# Patient Record
Sex: Male | Born: 1965 | Race: White | Hispanic: No | Marital: Single | Smoking: Current every day smoker
Health system: Southern US, Community
[De-identification: ages and names within clinical notes are randomized; demographics above are authoritative.]

## PROBLEM LIST (undated history)

## (undated) DIAGNOSIS — R6882 Decreased libido: Secondary | ICD-10-CM

## (undated) DIAGNOSIS — F17200 Nicotine dependence, unspecified, uncomplicated: Secondary | ICD-10-CM

## (undated) DIAGNOSIS — F32A Depression, unspecified: Secondary | ICD-10-CM

## (undated) DIAGNOSIS — G63 Polyneuropathy in diseases classified elsewhere: Secondary | ICD-10-CM

## (undated) DIAGNOSIS — G8929 Other chronic pain: Secondary | ICD-10-CM

## (undated) DIAGNOSIS — F321 Major depressive disorder, single episode, moderate: Secondary | ICD-10-CM

## (undated) DIAGNOSIS — E1149 Type 2 diabetes mellitus with other diabetic neurological complication: Secondary | ICD-10-CM

## (undated) DIAGNOSIS — M545 Low back pain: Secondary | ICD-10-CM

## (undated) DIAGNOSIS — F329 Major depressive disorder, single episode, unspecified: Secondary | ICD-10-CM

## (undated) HISTORY — DX: Other chronic pain: G89.29

## (undated) HISTORY — PX: HERNIA REPAIR: SHX51

## (undated) HISTORY — DX: Depression, unspecified: F32.A

## (undated) HISTORY — DX: Decreased libido: R68.82

## (undated) HISTORY — DX: Nicotine dependence, unspecified, uncomplicated: F17.200

## (undated) HISTORY — DX: Polyneuropathy in diseases classified elsewhere: G63

## (undated) HISTORY — DX: Type 2 diabetes mellitus with other diabetic neurological complication: E11.49

## (undated) HISTORY — DX: Major depressive disorder, single episode, moderate: F32.1

## (undated) HISTORY — DX: Major depressive disorder, single episode, unspecified: F32.9

## (undated) HISTORY — DX: Low back pain: M54.5

---

## 1999-05-03 ENCOUNTER — Emergency Department (HOSPITAL_COMMUNITY): Admission: EM | Admit: 1999-05-03 | Discharge: 1999-05-03 | Payer: Self-pay | Admitting: Emergency Medicine

## 1999-05-03 ENCOUNTER — Encounter: Payer: Self-pay | Admitting: Emergency Medicine

## 2001-11-23 ENCOUNTER — Emergency Department (HOSPITAL_COMMUNITY): Admission: EM | Admit: 2001-11-23 | Discharge: 2001-11-23 | Payer: Self-pay | Admitting: Emergency Medicine

## 2003-08-22 ENCOUNTER — Emergency Department (HOSPITAL_COMMUNITY): Admission: EM | Admit: 2003-08-22 | Discharge: 2003-08-22 | Payer: Self-pay | Admitting: Internal Medicine

## 2003-09-05 ENCOUNTER — Emergency Department (HOSPITAL_COMMUNITY): Admission: EM | Admit: 2003-09-05 | Discharge: 2003-09-05 | Payer: Self-pay | Admitting: Emergency Medicine

## 2003-12-15 ENCOUNTER — Emergency Department (HOSPITAL_COMMUNITY): Admission: EM | Admit: 2003-12-15 | Discharge: 2003-12-15 | Payer: Self-pay | Admitting: Emergency Medicine

## 2003-12-25 ENCOUNTER — Emergency Department (HOSPITAL_COMMUNITY): Admission: EM | Admit: 2003-12-25 | Discharge: 2003-12-25 | Payer: Self-pay | Admitting: Emergency Medicine

## 2014-02-11 ENCOUNTER — Encounter (HOSPITAL_COMMUNITY): Payer: Self-pay | Admitting: Emergency Medicine

## 2014-02-11 ENCOUNTER — Emergency Department (HOSPITAL_COMMUNITY)
Admission: EM | Admit: 2014-02-11 | Discharge: 2014-02-11 | Disposition: A | Discharge status: Home / Self Care | Payer: Self-pay | Attending: Emergency Medicine | Admitting: Emergency Medicine

## 2014-02-11 DIAGNOSIS — E119 Type 2 diabetes mellitus without complications: Secondary | ICD-10-CM | POA: Insufficient documentation

## 2014-02-11 DIAGNOSIS — M79671 Pain in right foot: Secondary | ICD-10-CM

## 2014-02-11 DIAGNOSIS — Z794 Long term (current) use of insulin: Secondary | ICD-10-CM | POA: Insufficient documentation

## 2014-02-11 DIAGNOSIS — M79672 Pain in left foot: Secondary | ICD-10-CM

## 2014-02-11 DIAGNOSIS — M79609 Pain in unspecified limb: Secondary | ICD-10-CM | POA: Insufficient documentation

## 2014-02-11 DIAGNOSIS — R000 Tachycardia, unspecified: Secondary | ICD-10-CM | POA: Insufficient documentation

## 2014-02-11 DIAGNOSIS — F17200 Nicotine dependence, unspecified, uncomplicated: Secondary | ICD-10-CM | POA: Insufficient documentation

## 2014-02-11 LAB — CBG MONITORING, ED: Glucose-Capillary: 197 mg/dL — ABNORMAL HIGH (ref 70–99)

## 2014-02-11 MED ORDER — GABAPENTIN 100 MG PO CAPS: 100.0000 mg | ORAL_CAPSULE | Freq: Three times a day (TID) | ORAL | Status: DC

## 2014-02-11 MED ORDER — HYDROCODONE-ACETAMINOPHEN 5-325 MG PO TABS: ORAL_TABLET | ORAL | Status: DC

## 2014-02-11 MED ORDER — NAPROXEN 500 MG PO TABS: 500.0000 mg | ORAL_TABLET | Freq: Two times a day (BID) | ORAL | Status: DC

## 2014-02-11 MED ORDER — KETOROLAC TROMETHAMINE 60 MG/2ML IM SOLN
30.0000 mg | Freq: Once | INTRAMUSCULAR | Status: AC
  Administered 2014-02-11: 30 mg via INTRAMUSCULAR
  Filled 2014-02-11 (×2): qty 2

## 2014-02-11 MED ORDER — BACITRACIN-NEOMYCIN-POLYMYXIN 400-5-5000 EX OINT
TOPICAL_OINTMENT | Freq: Once | CUTANEOUS | Status: AC
  Administered 2014-02-11: 1 via TOPICAL
  Filled 2014-02-11: qty 1

## 2014-02-11 MED ORDER — OXYCODONE-ACETAMINOPHEN 5-325 MG PO TABS
1.0000 | ORAL_TABLET | Freq: Once | ORAL | Status: AC
  Administered 2014-02-11: 1 via ORAL
  Filled 2014-02-11: qty 1

## 2014-02-11 NOTE — ED Notes (Signed)
Reports diabetic nerve pain to bil legs and feet x 3 days.

## 2014-02-11 NOTE — ED Provider Notes (Signed)
CSN: [id number]     Arrival date & time 02/11/14  1105 History   First MD Initiated Contact with Patient 02/11/14 1200     Chief Complaint  Patient presents with  . Leg Pain     (Consider location/radiation/quality/duration/timing/severity/associated sxs/prior Treatment) HPI Jerry Shepherd is a 48 y.o. male who presents to the ED with bilateral foot pain which he states is due to his diabetic nerve pain. The pain off and on for several month but for the past 3 days the pain has been severe. He states he has taken nothing for pain. His diabetic doctor is in [place]. He is moving to [place] and does not have a PCP yet. He has talked with his doctor about the pain before and was prescribed medication for pain but it did not help.   Past Medical History  Diagnosis Date  . Diabetes mellitus without complication    Past Surgical History  Procedure Laterality Date  . Hernia repair     No family history on file. History  Substance Use Topics  . Smoking status: Current Every Day Smoker    Types: Cigarettes  . Smokeless tobacco: Not on file  . Alcohol Use: No    Review of Systems Negative except as stated in HPI   Allergies  Review of patient's allergies indicates no known allergies.  Home Medications   Prior to Admission medications   Medication Sig Start Date End Date Taking? Authorizing Provider  insulin glargine (LANTUS) 100 UNIT/ML injection Inject 15 Units into the skin 2 (two) times daily.   Yes Historical Provider, MD   BP 129/94  Pulse 117  Temp(Src) 98 F (36.7 C) (Oral)  Resp 20  Ht  (1.803 m)  Wt 150 lb (68.04 kg)  BMI 20.93 kg/m2  SpO2 100% Physical Exam  Nursing note and vitals reviewed. Constitutional: He is oriented to person, place, and time. He appears well-developed and well-nourished.  HENT:  Head: Normocephalic.  Eyes: Conjunctivae and EOM are normal.  Neck: Neck supple.  Cardiovascular: Tachycardia present.   Pulmonary/Chest: Effort  normal.  Musculoskeletal: Normal range of motion.  Pedal pulses equal, adequate circulation, good touch sensation.   Neurological: He is alert and oriented to person, place, and time. No cranial nerve deficit.  Skin: Skin is warm and dry.  Psychiatric: He has a normal mood and affect. His behavior is normal.    ED Course  Procedures (including critical care time) Labs Review Labs Reviewed  CBG MONITORING, ED - Abnormal; Notable for the following:    Glucose-Capillary 197 (*)    All other components within normal limits    MDM  48 y.o. male with bilateral foot pain most likely due to neuropathic pain. Will treat for pain and he will follow up with a PCP here in [place]. Stable for discharge without neurovascular deficits.    Medication List    TAKE these medications       gabapentin 100 MG capsule  Commonly known as:  NEURONTIN  Take 1 capsule (100 mg total) by mouth 3 (three) times daily.     HYDROcodone-acetaminophen 5-325 MG per tablet  Commonly known as:  NORCO/VICODIN  Take one tablet at bedtime if needed for severe pain     naproxen 500 MG tablet  Commonly known as:  NAPROSYN  Take 1 tablet (500 mg total) by mouth 2 (two) times daily.      ASK your doctor about these medications       insulin glargine  100 UNIT/ML injection  Commonly known as:  LANTUS  Inject 15 Units into the skin 2 (two) times daily.           [facility] Orlene Och, [place] 02/11/14 1743

## 2014-02-12 NOTE — ED Provider Notes (Signed)
Medical screening examination/treatment/procedure(s) were performed by non-physician practitioner and as supervising physician I was immediately available for consultation/collaboration.   EKG Interpretation None       Donnetta Hutching, MD 02/12/14 [phone]

## 2014-02-16 ENCOUNTER — Encounter (HOSPITAL_COMMUNITY): Payer: Self-pay | Admitting: Emergency Medicine

## 2014-02-16 ENCOUNTER — Emergency Department (HOSPITAL_COMMUNITY)
Admission: EM | Admit: 2014-02-16 | Discharge: 2014-02-16 | Disposition: A | Discharge status: Home / Self Care | Payer: Self-pay | Attending: Emergency Medicine | Admitting: Emergency Medicine

## 2014-02-16 DIAGNOSIS — G629 Polyneuropathy, unspecified: Secondary | ICD-10-CM

## 2014-02-16 DIAGNOSIS — Z791 Long term (current) use of non-steroidal anti-inflammatories (NSAID): Secondary | ICD-10-CM | POA: Insufficient documentation

## 2014-02-16 DIAGNOSIS — Z760 Encounter for issue of repeat prescription: Secondary | ICD-10-CM | POA: Insufficient documentation

## 2014-02-16 DIAGNOSIS — G5790 Unspecified mononeuropathy of unspecified lower limb: Secondary | ICD-10-CM | POA: Insufficient documentation

## 2014-02-16 DIAGNOSIS — Z79899 Other long term (current) drug therapy: Secondary | ICD-10-CM | POA: Insufficient documentation

## 2014-02-16 DIAGNOSIS — F17200 Nicotine dependence, unspecified, uncomplicated: Secondary | ICD-10-CM | POA: Insufficient documentation

## 2014-02-16 DIAGNOSIS — Z794 Long term (current) use of insulin: Secondary | ICD-10-CM | POA: Insufficient documentation

## 2014-02-16 DIAGNOSIS — E119 Type 2 diabetes mellitus without complications: Secondary | ICD-10-CM | POA: Insufficient documentation

## 2014-02-16 DIAGNOSIS — G609 Hereditary and idiopathic neuropathy, unspecified: Secondary | ICD-10-CM | POA: Insufficient documentation

## 2014-02-16 LAB — CBG MONITORING, ED: Glucose-Capillary: 242 mg/dL — ABNORMAL HIGH (ref 70–99)

## 2014-02-16 MED ORDER — GABAPENTIN 100 MG PO CAPS: 200.0000 mg | ORAL_CAPSULE | Freq: Three times a day (TID) | ORAL | Status: DC

## 2014-02-16 MED ORDER — HYDROMORPHONE HCL PF 1 MG/ML IJ SOLN
1.0000 mg | Freq: Once | INTRAMUSCULAR | Status: AC
  Administered 2014-02-16: 1 mg via INTRAMUSCULAR
  Filled 2014-02-16: qty 1

## 2014-02-16 MED ORDER — HYDROCODONE-ACETAMINOPHEN 5-325 MG PO TABS: 1.0000 | ORAL_TABLET | ORAL | Status: DC | PRN

## 2014-02-16 NOTE — ED Notes (Signed)
Pt with neuropathy and has been seen here for same, states ran out of pain meds and neurontin

## 2014-02-16 NOTE — ED Provider Notes (Signed)
CSN: [id number]     Arrival date & time 02/16/14  1116 History  This chart was scribed for non-physician practitioner, Burgess Amor, PA-C,working with Raeford Razor, MD, by Karle Plumber, ED Scribe. This patient was seen in room APFT22/APFT22 and the patient's care was started at 1:07 PM.  Chief Complaint  Patient presents with  . Peripheral Neuropathy   The history is provided by the patient. No language interpreter was used.   HPI Comments:  Jerry Shepherd is a 48 y.o. male with PMH of DM and neuropathy who presents to the Emergency Department complaining of worsening aching, burning neuropathy pain secondary to peripheral neuropathy that began about 6 months ago. He reports associated paresthesias of the lower extremities and feet bilaterally. [place] and has not established care with a PCP yet. He was seen here 5 days ago for similar symptoms but is now out of the Gabapentin he was prescribed. He states he was taking 4-5 capsules at a time with no significant relief of his pain. He states he was prescribe Vicodin and Naproxen that he took one in the morning and one in the evening with moderate relief of the pain but is also out of those as well with the last dose last night. He denies nausea, vomiting or fevers. He states he is currently trying to find a PCP.   Past Medical History  Diagnosis Date  . Diabetes mellitus without complication   . Neuropathy    Past Surgical History  Procedure Laterality Date  . Hernia repair     History reviewed. No pertinent family history. History  Substance Use Topics  . Smoking status: Current Every Day Smoker    Types: Cigarettes  . Smokeless tobacco: Not on file  . Alcohol Use: No    Review of Systems  Constitutional: Negative for fever.  HENT: Negative for congestion and sore throat.   Eyes: Negative.   Respiratory: Negative for chest tightness and shortness of breath.   Cardiovascular: Negative for chest  pain and leg swelling.  Gastrointestinal: Negative for nausea, vomiting and abdominal pain.  Genitourinary: Negative.   Musculoskeletal: Negative for arthralgias, joint swelling and neck pain.       Neuropathy pain of bilateral lower extremities.  Skin: Negative.  Negative for rash and wound.  Neurological: Negative for dizziness, weakness, light-headedness, numbness and headaches.  Psychiatric/Behavioral: Negative.     Allergies  Review of patient's allergies indicates no known allergies.  Home Medications   Prior to Admission medications   Medication Sig Start Date End Date Taking? Authorizing Provider  acetaminophen (TYLENOL) 500 MG tablet Take 1,000 mg by mouth daily as needed for moderate pain.   Yes Historical Provider, MD  gabapentin (NEURONTIN) 100 MG capsule Take 1 capsule (100 mg total) by mouth 3 (three) times daily. 02/11/14  Yes Hope Orlene Och, NP  HYDROcodone-acetaminophen (NORCO/VICODIN) 5-325 MG per tablet Take one tablet at bedtime if needed for severe pain 02/11/14  Yes Hope Orlene Och, NP  insulin glargine (LANTUS) 100 UNIT/ML injection Inject 15 Units into the skin 2 (two) times daily.   Yes Historical Provider, MD  naproxen (NAPROSYN) 500 MG tablet Take 1 tablet (500 mg total) by mouth 2 (two) times daily. 02/11/14  Yes Hope Orlene Och, NP  gabapentin (NEURONTIN) 100 MG capsule Take 2 capsules (200 mg total) by mouth 3 (three) times daily. 02/16/14   Burgess Amor, PA-C  HYDROcodone-acetaminophen (NORCO/VICODIN) 5-325 MG per tablet Take 1 tablet by  mouth every 4 (four) hours as needed. 02/16/14   Burgess Amor, PA-C   Triage Vitals: BP 133/98  Pulse 124  Temp(Src) 97.9 F (36.6 C) (Oral)  Resp 20  Ht  (1.803 m)  Wt 155 lb (70.308 kg)  BMI 21.63 kg/m2  SpO2 100% Physical Exam  Nursing note and vitals reviewed. Constitutional: He appears well-developed and well-nourished.  HENT:  Head: Normocephalic and atraumatic.  Eyes: Conjunctivae are normal.  Cardiovascular: Normal  rate.   Pedal pulses equal bilaterally.  Pulmonary/Chest: Effort normal.  Musculoskeletal: Normal range of motion.  Calves soft bilaterally.   Neurological: He is alert.  Skin: Skin is warm and dry.  Skin appears healthy on lower extremities.  Psychiatric: He has a normal mood and affect.    ED Course  Procedures (including critical care time) DIAGNOSTIC STUDIES: Oxygen Saturation is 100% on RA, normal by my interpretation.   COORDINATION OF CARE: 1:14 PM- Will order Dilaudid injection prior to discharge. Will prescribe pain medication and Gabapentin. Pt verbalizes understanding and agrees to plan.  Medications  HYDROmorphone (DILAUDID) injection 1 mg (1 mg Intramuscular Given 02/16/14 1321)    Labs Review Labs Reviewed  CBG MONITORING, ED - Abnormal; Notable for the following:    Glucose-Capillary 242 (*)    All other components within normal limits  CBG MONITORING, ED    Imaging Review No results found.   EKG Interpretation None      MDM   Final diagnoses:  Peripheral neuropathy  Medication refill    Pt advised that gabapentin is a tapered medicine and will not work immediately, he needs to take as prescribed.  Also prescribed hydrocodone.  He was given resource for assistance finding pcp.  I personally performed the services described in this documentation, which was scribed in my presence. The recorded information has been reviewed and is accurate.    Burgess Amor, PA-C 02/18/14 2243

## 2014-02-16 NOTE — ED Notes (Signed)
Pt CBG 242 in tx room, states that it was lower this am but that he ate a pop tart prior to coming to er.

## 2014-02-16 NOTE — ED Notes (Signed)
Julie PA at bedside,  

## 2014-02-16 NOTE — ED Notes (Addendum)
Pt returns to er for continued bilateral lower extremities, states that he has hx of neuropathy, has recently moved to [place] from [place] but has not been able to complete all his paperwork for medicaid and pcp in [place].  Was seen in er on 02/11/2014 for same pain, was prescribed gabapentin100 MG capsule but that it has not helped with the pain.

## 2014-02-16 NOTE — Discharge Instructions (Signed)
Peripheral Neuropathy °Peripheral neuropathy is a type of nerve damage. It affects nerves that carry signals between the spinal cord and other parts of the body. These are called peripheral nerves. With peripheral neuropathy, one nerve or a group of nerves may be damaged.  °CAUSES  °Many things can damage peripheral nerves. For some people with peripheral neuropathy, the cause is unknown. Some causes include: °· Diabetes. This is the most common cause of peripheral neuropathy. °· Injury to a nerve. °· Pressure or stress on a nerve that lasts a long time. °· Too little vitamin B. Alcoholism can lead to this. °· Infections. °· Autoimmune diseases, such as multiple sclerosis and systemic lupus erythematosus. °· Inherited nerve diseases. °· Some medicines, such as cancer drugs. °· Toxic substances, such as lead and mercury. °· Too little blood flowing to the legs. °· Kidney disease. °· Thyroid disease. °SIGNS AND SYMPTOMS  °Different people have different symptoms. The symptoms you have will depend on which of your nerves is damaged.  Common symptoms include: °· Loss of feeling (numbness) in the feet and hands. °· Tingling in the feet and hands. °· Pain that burns. °· Very sensitive skin. °· Weakness. °· Not being able to move a part of the body (paralysis). °· Muscle twitching. °· Clumsiness or poor coordination. °· Loss of balance. °· Not being able to control your bladder. °· Feeling dizzy. °· Sexual problems. °DIAGNOSIS  °Peripheral neuropathy is a symptom, not a disease. Finding the cause of peripheral neuropathy can be hard. To figure that out, your health care provider will take a medical history and do a physical exam. A neurological exam will also be done. This involves checking things affected by your brain, spinal cord, and nerves (nervous system). For example, your health care provider will check your reflexes, how you move, and what you can feel.  °Other types of tests may also be ordered, such as: °· Blood  tests. °· A test of the fluid in your spinal cord. °· Imaging tests, such as CT scans or an MRI. °· Electromyography (EMG). This test checks the nerves that control muscles. °· Nerve conduction velocity tests. These tests check how fast messages pass through your nerves. °· Nerve biopsy. A small piece of nerve is removed. It is then checked under a microscope. °TREATMENT  °· Medicine is often used to treat peripheral neuropathy. Medicines may include: °¨ Pain-relieving medicines. Prescription or over-the-counter medicine may be suggested. °¨ Antiseizure medicine. This may be used for pain. °¨ Antidepressants. These also may help ease pain from neuropathy. °¨ Lidocaine. This is a numbing medicine. You might wear a patch or be given a shot. °¨ Mexiletine. This medicine is typically used to help control irregular heart rhythms. °· Surgery. Surgery may be needed to relieve pressure on a nerve or to destroy a nerve that is causing pain. °· Physical therapy to help movement. °· Assistive devices to help movement. °HOME CARE INSTRUCTIONS  °· Only take over-the-counter or prescription medicines as directed by your health care provider. Follow the instructions carefully for any given medicines. Do not take any other medicines without first getting approval from your health care provider. °· If you have diabetes, work closely with your health care provider to keep your blood sugar under control. °· If you have numbness in your feet: °¨ Check every day for signs of injury or infection. Watch for redness, warmth, and swelling. °¨ Wear padded socks and comfortable shoes. These help protect your feet. °· Do not do   things that put pressure on your damaged nerve.  Do not smoke. Smoking keeps blood from getting to damaged nerves.  Avoid or limit alcohol. Too much alcohol can cause a lack of B vitamins. These vitamins are needed for healthy nerves.  Develop a good support system. Coping with peripheral neuropathy can be  stressful. Talk to a mental health specialist or join a support group if you are struggling.  Follow up with your health care provider as directed. SEEK MEDICAL CARE IF:   You have new signs or symptoms of peripheral neuropathy.  You are struggling emotionally from dealing with peripheral neuropathy.  You have a fever. SEEK IMMEDIATE MEDICAL CARE IF:   You have an injury or infection that is not healing.  You feel very dizzy or begin vomiting.  You have chest pain.  You have trouble breathing. Document Released: 05/13/2002 Document Revised: 02/02/2011 Document Reviewed: 01/28/2013 [facility] Patient Information 2015 [place], [place]. This information is not intended to replace advice given to you by your health care provider. Make sure you discuss any questions you have with your health care provider.     Emergency Department Resource Guide 1) Find a Doctor and Pay Out of Pocket Although you won't have to find out who is covered by your insurance plan, it is a good idea to ask around and get recommendations. You will then need to call the office and see if the doctor you have chosen will accept you as a new patient and what types of options they offer for patients who are self-pay. Some doctors offer discounts or will set up payment plans for their patients who do not have insurance, but you will need to ask so you aren't surprised when you get to your appointment.  2) Contact Your Local Health Department Not all health departments have doctors that can see patients for sick visits, but many do, so it is worth a call to see if yours does. If you don't know where your local health department is, you can check in your phone book. The CDC also has a tool to help you locate your state's health department, and many state websites also have listings of all of their local health departments.  3) Find a Walk-in Clinic If your illness is not likely to be very severe or complicated, you may want  to try a walk in clinic. These are popping up all over the country in pharmacies, drugstores, and shopping centers. They're usually staffed by nurse practitioners or physician assistants that have been trained to treat common illnesses and complaints. They're usually fairly quick and inexpensive. However, if you have serious medical issues or chronic medical problems, these are probably not your best option.  No Primary Care Doctor: - Call Health Connect at  [phone] - they can help you locate a primary care doctor that  accepts your insurance, provides certain services, etc. - Physician Referral Service- [phone]  Chronic Pain Problems: Organization         Address  Phone   Notes  [facility]  [phone] Patients need to be referred by their primary care doctor.   Medication Assistance: Organization         Address  Phone   Notes  [facility] Medication [facility] [address] [place] [place], [place] [address] [phone] --Must be a resident of [facility] -- Must have NO insurance coverage whatsoever (no Medicaid/ Medicare, etc.) -- The pt. MUST have a primary  care doctor that directs their care regularly and follows them in the community   [facility]  [phone]   [employer]  [phone]    Agencies that provide inexpensive medical care: Organization         Address  Phone   Notes  [facility]  [phone]   [facility]    [phone]   [facility] [address] [place], [place] [address] [phone]   [facility] [address] [place], [place] [phone]   Planned Parenthood    [phone]   [facility]    [phone]   Community Health and [facility]  [address], [place] Phone:  [phone], Fax:  [phone] Hours of Operation:  9 am - 6 pm, M-F.  Also accepts Medicaid/Medicare and self-pay.   [facility]  [address], [place] Phone: [phone], Fax: [phone]. Hours of Operation:  8:30 am - 5:30 pm, M-F.  Also accepts Medicaid and self-pay.  [facility] [place] [address] [place] Phone: [phone]   [facility] [address] Natasha Bence [place], [place] [phone], Ext. [phone] Mondays & Thursdays: 7-9 AM.  First 15 patients are seen on a first come, first serve basis.    Medicaid-accepting [facility] Providers:  Organization         Address  Phone   Notes  [facility] [address], [place] [phone] Also accepts self-pay patients.  [facility] [address] Laurell Josephs [place], [place]  [phone]   [facility] [address], [place] [phone]   [facility] [address] [place] [phone]   Renaye Rakers [address], [place]   [phone] Only accepts [place] Access [place] patients after they have their name applied to their card.   Self-Pay (no insurance) in [facility]:  Organization         Address  Phone   Notes  Sickle Cell Patients, [facility] [address] [place], [place] [phone]   [facility] [address] [place], [place] [phone]   [facility] [place]  [address], [place] [phone]   [facility]/Dr. Osei-Bonsu  [address], [place] or [address], [place] [phone] Phone number for both [place] and [place] locations is the same.  [facility] [address] [place] [phone]   [facility] [address] [place] or [address] [phone]   [facility] [address] [place] [phone], phone; [phone], fax Sees patients 1st and 3rd Saturday of every month.  Must not qualify for public or private insurance (i.e. Medicaid, Medicare, [place] Health Choice, Veterans' Benefits)  Household income should be no more than 200% of the poverty level The clinic cannot treat you if you are pregnant or think you are pregnant  Sexually transmitted diseases are not treated at the clinic.    Dental Care: Organization         Address  Phone  Notes  [facility] [address] [place], [place] [phone]  Accepts children up to age 15 who are enrolled in Medicaid or [place] Health Choice; pregnant women with a Medicaid card; and children who have applied for Medicaid or [place] Health Choice, but were declined, whose parents can pay a reduced fee at time of service.  [facility] Department of [facility]  [address], [place] [phone] Accepts children up to age 56 who are enrolled in [place] or [place] Health Choice; pregnant women with a Medicaid card; and children who have applied for Medicaid or [place] Health Choice, but were declined, whose parents can pay a reduced fee at time of service.  [facility]  [address] [place], [place] [phone] Patients are seen by appointment only. Walk-ins are not accepted. [facility] will see patients 71 years of age and older. Monday - Tuesday (8am-5pm) Most Wednesdays (8:30-5pm) $30 per visit, cash only  [facility] Adult [facility] Access [facility]  [address], [facility] [phone] Patients are seen by appointment only. Walk-ins are not accepted. [facility] will see patients 31 years of age and older. One Wednesday Evening (Monthly: Volunteer Based).  $30 per visit, cash only  [employer]  [phone] for adults; Children under age 65, call Graduate Pediatric Dentistry at [phone]. Children aged 33-14, please call [phone] to request a pediatric application.  Dental services are provided in all areas of dental care including fillings, crowns and bridges, complete and partial dentures, implants, gum treatment, root canals, and extractions. Preventive care is also provided. Treatment is provided to both adults and children. Patients are selected via a lottery and there is often a waiting list.   [facility] [address] [place]  [phone] [email or web address]   [facility] [address] [place], [place] [phone], Ext. [phone] Second and Fourth Thursday of each month, opens at 6:30 AM; Clinic ends at 9 AM.  Patients are seen on a first-come first-served basis, and a limited number are seen during each clinic.   [facility]  [address] Ether Griffins [place], [place] [phone]   Eligibility Requirements You must have lived in [place], [place], or [place] counties for at least the last three months.   You cannot be eligible for state or federal sponsored [place], including [employer], [place], or [employer].   You generally cannot be eligible for healthcare insurance through your employer.    How to apply: Eligibility screenings are held every Tuesday and Wednesday afternoon from 1:00 pm until 4:00 pm. You do not need an appointment for the interview!  [facility] [address], [place], [place] [phone]   [facility] Health Department  [phone]   [facility] Health Department  [phone]   [facility] Health Department  [phone]    Behavioral Health Resources in the Community: Intensive Outpatient Programs Organization         Address  Phone  Notes  [facility] Services [address], [place], [place] [phone]   [facility] [address] [place], [place] [phone]   [facility] [address] [place], [place]  [phone]    [facility] Mental Health [address]  [place], [place] [phone] or [phone]   Substance Abuse Resources Organization         Address  Phone  Notes  [facility]  [phone]  [facility]  [phone]   [facility] [place]  [phone]   Floydene Flock  [phone]   Residential & Outpatient Substance Abuse Program  [phone]   Psychological Services Organization         Address  Phone  Notes  [facility] Behavioral Health  [phone]   [facility] Services  [phone]   [facility] Mental Health [address], [place] [phone] or [phone]    Mobile Crisis Teams Organization         Address  Phone  Notes  [facility], Mobile Crisis Care Unit  [phone]   [facility]  [address] [place], [place] [phone]   Doristine Locks [address] [place] [phone]    Self-Help/Support Groups Organization         Address  Phone             Notes  [facility] [place] - variety of support groups  336- I7437963 Call for more information  Narcotics Anonymous (NA), [facility] [address], [employer] [place]  2 meetings at this location   Statistician         Address  Phone  Notes  [facility] [address],    [place]  [phone]   [facility]  [address], [place] [address], [place], [place] [phone]   [facility] [address] [place], [place] [phone] Admissions: 8am-3pm M-F  [facility] [address],    [place], [place] [phone]   [facility] [address] [place], [place], [place] [phone]   [facility] [address],  [place], [place] [phone]   [facility] - Intensive Outpatient [address] Laurell Josephs [address], [place], [place] [phone]   [facility] ([facility]iction [facility] [address] [place]  [place], [place] [phone] or [phone]   [facility] ([facility]) [address] [place], [place] [phone] Accepts Medicaid  [facility] [address]  [place] [phone] Substance Abuse/Addiction Treatment   [facility] Organization         Address  Phone  Notes  [facility]  [phone]   Angie Fava, PhD [address] Ervin Knack [place], [place]   [phone] or [phone]   [facility] Behavioral   [address] [place], [place] [phone]   [facility] [address] [place], [place] [phone] Insurance/Medicaid/sponsorship through [facility] and Families [address]                                    [place], [place] [phone] Therapy/tele-psych/case  [facility] [address], [place] [phone]    Dr. Lolly Mustache  [phone]   [facility] [place]  [employer] [facility] Endoscopy Dept. 1) [address] [place] 2) [address], [place] 3)  [address], [place] [phone]  [phone]   [facility] Child Abuse Hotline 564-781-4046 or (709)[phone] (After Hours)       You may take the hydrocodone prescribed for pain relief.  This will make you drowsy - do not drive within 4 hours of taking this medication.  Take the gabapentin as prescribed.  This medicine does not  work right away - taking more initially will not speed up its therapeutic value as discussed.

## 2014-02-19 NOTE — ED Provider Notes (Signed)
Medical screening examination/treatment/procedure(s) were performed by non-physician practitioner and as supervising physician I was immediately available for consultation/collaboration.   EKG Interpretation None       Raeford Razor, MD 02/19/14 1238

## 2014-08-03 ENCOUNTER — Emergency Department (HOSPITAL_COMMUNITY)
Admission: EM | Admit: 2014-08-03 | Discharge: 2014-08-03 | Discharge status: Against Medical Advice | Payer: Self-pay | Attending: Emergency Medicine | Admitting: Emergency Medicine

## 2014-08-03 ENCOUNTER — Encounter (HOSPITAL_COMMUNITY): Payer: Self-pay | Admitting: Emergency Medicine

## 2014-08-03 DIAGNOSIS — E1140 Type 2 diabetes mellitus with diabetic neuropathy, unspecified: Secondary | ICD-10-CM | POA: Insufficient documentation

## 2014-08-03 DIAGNOSIS — Z720 Tobacco use: Secondary | ICD-10-CM | POA: Insufficient documentation

## 2014-08-03 NOTE — ED Notes (Signed)
Called patient from waiting room to take to [facility] room #20 with no response.

## 2014-08-03 NOTE — ED Notes (Signed)
Pt reports pain from diabetic neuropathy in his legs and feet.

## 2014-08-04 LAB — CBG MONITORING, ED: GLUCOSE-CAPILLARY: 166 mg/dL — AB (ref 70–99)

## 2014-11-05 ENCOUNTER — Encounter (HOSPITAL_COMMUNITY): Payer: Self-pay | Admitting: *Deleted

## 2014-11-05 ENCOUNTER — Emergency Department (HOSPITAL_COMMUNITY)
Admission: EM | Admit: 2014-11-05 | Discharge: 2014-11-05 | Disposition: A | Discharge status: Home / Self Care | Payer: Self-pay | Attending: Emergency Medicine | Admitting: Emergency Medicine

## 2014-11-05 DIAGNOSIS — G8929 Other chronic pain: Secondary | ICD-10-CM | POA: Insufficient documentation

## 2014-11-05 DIAGNOSIS — G629 Polyneuropathy, unspecified: Secondary | ICD-10-CM | POA: Insufficient documentation

## 2014-11-05 DIAGNOSIS — Z720 Tobacco use: Secondary | ICD-10-CM | POA: Insufficient documentation

## 2014-11-05 DIAGNOSIS — Z79899 Other long term (current) drug therapy: Secondary | ICD-10-CM | POA: Insufficient documentation

## 2014-11-05 DIAGNOSIS — E119 Type 2 diabetes mellitus without complications: Secondary | ICD-10-CM | POA: Insufficient documentation

## 2014-11-05 DIAGNOSIS — Z791 Long term (current) use of non-steroidal anti-inflammatories (NSAID): Secondary | ICD-10-CM | POA: Insufficient documentation

## 2014-11-05 MED ORDER — GABAPENTIN 300 MG PO CAPS: 300.0000 mg | ORAL_CAPSULE | Freq: Three times a day (TID) | ORAL | Status: DC

## 2014-11-05 MED ORDER — HYDROCODONE-ACETAMINOPHEN 5-325 MG PO TABS
2.0000 | ORAL_TABLET | Freq: Once | ORAL | Status: AC
  Administered 2014-11-05: 2 via ORAL
  Filled 2014-11-05: qty 2

## 2014-11-05 MED ORDER — GABAPENTIN 300 MG PO CAPS
300.0000 mg | ORAL_CAPSULE | Freq: Once | ORAL | Status: AC
  Administered 2014-11-05: 300 mg via ORAL
  Filled 2014-11-05: qty 1

## 2014-11-05 NOTE — Discharge Instructions (Signed)
Chronic Pain Chronic pain can be defined as pain that is off and on and lasts for 3-6 months or longer. Many things cause chronic pain, which can make it difficult to make a diagnosis. There are many treatment options available for chronic pain. However, finding a treatment that works well for you may require trying various approaches until the right one is found. Many people benefit from a combination of two or more types of treatment to control their pain. SYMPTOMS  Chronic pain can occur anywhere in the body and can range from mild to very severe. Some types of chronic pain include:  Headache.  Low back pain.  Cancer pain.  Arthritis pain.  Neurogenic pain. This is pain resulting from damage to nerves. People with chronic pain may also have other symptoms such as:  Depression.  Anger.  Insomnia.  Anxiety. DIAGNOSIS  Your health care provider will help diagnose your condition over time. In many cases, the initial focus will be on excluding possible conditions that could be causing the pain. Depending on your symptoms, your health care provider may order tests to diagnose your condition. Some of these tests may include:   Blood tests.   CT scan.   MRI.   X-rays.   Ultrasounds.   Nerve conduction studies.  You may need to see a specialist.  TREATMENT  Finding treatment that works well may take time. You may be referred to a pain specialist. He or she may prescribe medicine or therapies, such as:   Mindful meditation or yoga.  Shots (injections) of numbing or pain-relieving medicines into the spine or area of pain.  Local electrical stimulation.  Acupuncture.   Massage therapy.   Aroma, color, light, or sound therapy.   Biofeedback.   Working with a physical therapist to keep from getting stiff.   Regular, gentle exercise.   Cognitive or behavioral therapy.   Group support.  Sometimes, surgery may be recommended.  HOME CARE INSTRUCTIONS    Take all medicines as directed by your health care provider.   Lessen stress in your life by relaxing and doing things such as listening to calming music.   Exercise or be active as directed by your health care provider.   Eat a healthy diet and include things such as vegetables, fruits, fish, and lean meats in your diet.   Keep all follow-up appointments with your health care provider.   Attend a support group with others suffering from chronic pain. SEEK MEDICAL CARE IF:   Your pain gets worse.   You develop a new pain that was not there before.   You cannot tolerate medicines given to you by your health care provider.   You have new symptoms since your last visit with your health care provider.  SEEK IMMEDIATE MEDICAL CARE IF:   You feel weak.   You have decreased sensation or numbness.   You lose control of bowel or bladder function.   Your pain suddenly gets much worse.   You develop shaking.  You develop chills.  You develop confusion.  You develop chest pain.  You develop shortness of breath.  MAKE SURE YOU:  Understand these instructions.  Will watch your condition.  Will get help right away if you are not doing well or get worse. Document Released: 02/12/2002 Document Revised: 01/23/2013 Document Reviewed: 11/16/2012 [facility] Patient Information 2015 [place], [place]. This information is not intended to replace advice given to you by your health care provider. Make sure you discuss any  questions you have with your health care provider.  Neuropathic Pain We often think that pain has a physical cause. If we get rid of the cause, the pain should go away. Nerves themselves can also cause pain. It is called neuropathic pain, which means nerve abnormality. It may be difficult for the patients who have it and for the treating caregivers. Pain is usually described as acute (short-lived) or chronic (long-lasting). Acute pain is related to the  physical sensations caused by an injury. It can last from a few seconds to many weeks, but it usually goes away when normal healing occurs. Chronic pain lasts beyond the typical healing time. With neuropathic pain, the nerve fibers themselves may be damaged or injured. They then send incorrect signals to other pain centers. The pain you feel is real, but the cause is not easy to find.  CAUSES  Chronic pain can result from diseases, such as diabetes and shingles (an infection related to chickenpox), or from trauma, surgery, or amputation. It can also happen without any known injury or disease. The nerves are sending pain messages, even though there is no identifiable cause for such messages.   Other common causes of neuropathy include diabetes, phantom limb pain, or Regional Pain Syndrome (RPS).  As with all forms of chronic back pain, if neuropathy is not correctly treated, there can be a number of associated problems that lead to a downward cycle for the patient. These include depression, sleeplessness, feelings of fear and anxiety, limited social interaction and inability to do normal daily activities or work.  The most dramatic and mysterious example of neuropathic pain is called "phantom limb syndrome." This occurs when an arm or a leg has been removed because of illness or injury. The brain still gets pain messages from the nerves that originally carried impulses from the missing limb. These nerves now seem to misfire and cause troubling pain.  Neuropathic pain often seems to have no cause. It responds poorly to standard pain treatment. Neuropathic pain can occur after:  Shingles (herpes zoster virus infection).  A lasting burning sensation of the skin, caused usually by injury to a peripheral nerve.  Peripheral neuropathy which is widespread nerve damage, often caused by diabetes or alcoholism.  Phantom limb pain following an amputation.  Facial nerve problems (trigeminal  neuralgia).  Multiple sclerosis.  Reflex sympathetic dystrophy.  Pain which comes with cancer and cancer chemotherapy.  Entrapment neuropathy such as when pressure is put on a nerve such as in carpal tunnel syndrome.  Back, leg, and hip problems (sciatica).  Spine or back surgery.  HIV Infection or AIDS where nerves are infected by viruses. Your caregiver can explain items in the above list which may apply to you. SYMPTOMS  Characteristics of neuropathic pain are:  Severe, sharp, electric shock-like, shooting, lightening-like, knife-like.  Pins and needles sensation.  Deep burning, deep cold, or deep ache.  Persistent numbness, tingling, or weakness.  Pain resulting from light touch or other stimulus that would not usually cause pain.  Increased sensitivity to something that would normally cause pain, such as a pinprick. Pain may persist for months or years following the healing of damaged tissues. When this happens, pain signals no longer sound an alarm about current injuries or injuries about to happen. Instead, the alarm system itself is not working correctly.  Neuropathic pain may get worse instead of better over time. For some people, it can lead to serious disability. It is important to be aware that severe injury  in a limb can occur without a proper, protective pain response.Burns, cuts, and other injuries may go unnoticed. Without proper treatment, these injuries can become infected or lead to further disability. Take any injury seriously, and consult your caregiver for treatment. DIAGNOSIS  When you have a pain with no known cause, your caregiver will probably ask some specific questions:   Do you have any other conditions, such as diabetes, shingles, multiple sclerosis, or HIV infection?  How would you describe your pain? (Neuropathic pain is often described as shooting, stabbing, burning, or searing.)  Is your pain worse at any time of the day? (Neuropathic pain is  usually worse at night.)  Does the pain seem to follow a certain physical pathway?  Does the pain come from an area that has missing or injured nerves? (An example would be phantom limb pain.)  Is the pain triggered by minor things such as rubbing against the sheets at night? These questions often help define the type of pain involved. Once your caregiver knows what is happening, treatment can begin. Anticonvulsant, antidepressant drugs, and various pain relievers seem to work in some cases. If another condition, such as diabetes is involved, better management of that disorder may relieve the neuropathic pain.  TREATMENT  Neuropathic pain is frequently long-lasting and tends not to respond to treatment with narcotic type pain medication. It may respond well to other drugs such as antiseizure and antidepressant medications. Usually, neuropathic problems do not completely go away, but partial improvement is often possible with proper treatment. Your caregivers have large numbers of medications available to treat you. Do not be discouraged if you do not get immediate relief. Sometimes different medications or a combination of medications will be tried before you receive the results you are hoping for. See your caregiver if you have pain that seems to be coming from nowhere and does not go away. Help is available.  SEEK IMMEDIATE MEDICAL CARE IF:   There is a sudden change in the quality of your pain, especially if the change is on only one side of the body.  You notice changes of the skin, such as redness, black or purple discoloration, swelling, or an ulcer.  You cannot move the affected limbs. Document Released: 02/18/2004 Document Revised: 08/15/2011 Document Reviewed: 02/18/2004 [facility] Patient Information 2015 [place]. This information is not intended to replace advice given to you by your health care provider. Make sure you discuss any questions you have with your health care  provider.

## 2014-11-05 NOTE — ED Provider Notes (Signed)
TIME SEEN: 5:10 AM  CHIEF COMPLAINT: Chronic foot pain  HPI: Pt is a 49 y.o. male with history of diabetes with peripheral neuropathy who presents to the emergency department with pain in his bilateral feet, worse on the right side. He has this condition chronically but states it worsened the past several days. No history of injury. No swelling, erythema or warmth. No fever. No numbness or focal weakness. States that he has no longer taking gabapentin. Was seen by his PCP today and states they instructed him to come to the emergency department.  ROS: See HPI Constitutional: no fever  Eyes: no drainage  ENT: no runny nose   Cardiovascular:  no chest pain  Resp: no SOB  GI: no vomiting GU: no dysuria Integumentary: no rash  Allergy: no hives  Musculoskeletal: no leg swelling  Neurological: no slurred speech ROS otherwise negative  PAST MEDICAL HISTORY/PAST SURGICAL HISTORY:  Past Medical History  Diagnosis Date  . Diabetes mellitus without complication   . Neuropathy     MEDICATIONS:  Prior to Admission medications   Medication Sig Start Date End Date Taking? Authorizing Provider  gabapentin (NEURONTIN) 100 MG capsule Take 1 capsule (100 mg total) by mouth 3 (three) times daily. 02/11/14   Hope Orlene Och, NP  HYDROcodone-acetaminophen (NORCO/VICODIN) 5-325 MG per tablet Take 1 tablet by mouth every 4 (four) hours as needed. 02/16/14   Burgess Amor, PA-C  naproxen (NAPROSYN) 500 MG tablet Take 1 tablet (500 mg total) by mouth 2 (two) times daily. 02/11/14   Hope Orlene Och, NP    ALLERGIES:  No Known Allergies  SOCIAL HISTORY:  History  Substance Use Topics  . Smoking status: Current Every Day Smoker -- 1.00 packs/day    Types: Cigarettes  . Smokeless tobacco: Never Used  . Alcohol Use: No    FAMILY HISTORY: Family History  Problem Relation Age of Onset  . Diabetes Brother     EXAM: BP 129/89 mmHg  Pulse 94  Temp(Src) 98.1 F (36.7 C) (Oral)  Resp 20  Ht  (1.803 m)   Wt 167 lb (75.751 kg)  BMI 23.30 kg/m2  SpO2 99% CONSTITUTIONAL: Alert and oriented and responds appropriately to questions. Well-appearing; well-nourished HEAD: Normocephalic EYES: Conjunctivae clear, PERRL ENT: normal nose; no rhinorrhea; moist mucous membranes; pharynx without lesions noted NECK: Supple, no meningismus, no LAD  CARD: RRR; S1 and S2 appreciated; no murmurs, no clicks, no rubs, no gallops RESP: Normal chest excursion without splinting or tachypnea; breath sounds clear and equal bilaterally; no wheezes, no rhonchi, no rales, no hypoxia or respiratory distress, speaking full sentences ABD/GI: Normal bowel sounds; non-distended; soft, non-tender, no rebound, no guarding, no peritoneal signs BACK:  The back appears normal and is non-tender to palpation, there is no CVA tenderness EXT: Normal ROM in all joints; non-tender to palpation; no edema; normal capillary refill; no cyanosis, no calf tenderness or swelling, no erythema or warmth, 2+ DP pulses bilaterally, no sign of bony injury, compartment soft, no joint effusion    SKIN: Normal color for age and race; warm NEURO: Moves all extremities equally, reports slightly diminished sensation to light touch in the bilateral feet but otherwise sensation to light touch intact diffusely, cranial nerves II through XII intact PSYCH: The patient's mood and manner are appropriate. Grooming and personal hygiene are appropriate.  MEDICAL DECISION MAKING: Patient here with exacerbation of chronic pain. Discussed with him at length that I cannot treat his chronic pain with narcotics as an outpatient  and he will need to follow-up with his primary doctor. We'll give 1 dose of pain medicine in the ED and refill his gabapentin. I do not feel there is any emergent condition. There is no sign of cellulitis, septic arthritis. He has warm and well perfused lower extremities. No calf tenderness or swelling or history of DVT. I do not feel he needs further  emergent workup. We'll discharge home. Discussed return precautions.      Layla MawKristen N Ward, DO 11/05/14 [phone]

## 2014-11-05 NOTE — ED Notes (Signed)
Discharge instructions given, pt demonstrated teach back and verbal understanding. No concerns voiced.  

## 2014-11-05 NOTE — ED Notes (Signed)
Pt c/o pain to right foot x 3-4 days

## 2015-11-12 ENCOUNTER — Encounter (HOSPITAL_COMMUNITY): Payer: Self-pay

## 2015-11-12 ENCOUNTER — Emergency Department (HOSPITAL_COMMUNITY): Payer: Medicaid Other

## 2015-11-12 ENCOUNTER — Emergency Department (HOSPITAL_COMMUNITY)
Admission: EM | Admit: 2015-11-12 | Discharge: 2015-11-12 | Disposition: A | Discharge status: Home / Self Care | Payer: Medicaid Other | Attending: Emergency Medicine | Admitting: Emergency Medicine

## 2015-11-12 DIAGNOSIS — X500XXA Overexertion from strenuous movement or load, initial encounter: Secondary | ICD-10-CM | POA: Diagnosis not present

## 2015-11-12 DIAGNOSIS — E119 Type 2 diabetes mellitus without complications: Secondary | ICD-10-CM | POA: Insufficient documentation

## 2015-11-12 DIAGNOSIS — Y93F2 Activity, caregiving, lifting: Secondary | ICD-10-CM | POA: Diagnosis not present

## 2015-11-12 DIAGNOSIS — F17210 Nicotine dependence, cigarettes, uncomplicated: Secondary | ICD-10-CM | POA: Diagnosis not present

## 2015-11-12 DIAGNOSIS — Z791 Long term (current) use of non-steroidal anti-inflammatories (NSAID): Secondary | ICD-10-CM | POA: Diagnosis not present

## 2015-11-12 DIAGNOSIS — Y999 Unspecified external cause status: Secondary | ICD-10-CM | POA: Insufficient documentation

## 2015-11-12 DIAGNOSIS — Y929 Unspecified place or not applicable: Secondary | ICD-10-CM | POA: Diagnosis not present

## 2015-11-12 DIAGNOSIS — M25511 Pain in right shoulder: Secondary | ICD-10-CM

## 2015-11-12 MED ORDER — NAPROXEN 250 MG PO TABS: 250.0000 mg | ORAL_TABLET | Freq: Two times a day (BID) | ORAL | Status: DC

## 2015-11-12 NOTE — ED Notes (Signed)
Pt reports was picking up concrete bags a couple of weeks ago and has had pain in r shoulder since then.  Reports has a pin in that shoulder from a surgery a while back.

## 2015-11-12 NOTE — Discharge Instructions (Signed)
Shoulder Pain  The shoulder is the joint that connects your arms to your body. The bones that form the shoulder joint include the upper arm bone (humerus), the shoulder blade (scapula), and the collarbone (clavicle). The top of the humerus is shaped like a ball and fits into a rather flat socket on the scapula (glenoid cavity). A combination of muscles and strong, fibrous tissues that connect muscles to bones (tendons) support your shoulder joint and hold the ball in the socket. Small, fluid-filled sacs (bursae) are located in different areas of the joint. They act as cushions between the bones and the overlying soft tissues and help reduce friction between the gliding tendons and the bone as you move your arm. Your shoulder joint allows a wide range of motion in your arm. This range of motion allows you to do things like scratch your back or throw a ball. However, this range of motion also makes your shoulder more prone to pain from overuse and injury.  Causes of shoulder pain can originate from both injury and overuse and usually can be grouped in the following four categories:  1. Redness, swelling, and pain (inflammation) of the tendon (tendinitis) or the bursae (bursitis).  2. Instability, such as a dislocation of the joint.  3. Inflammation of the joint (arthritis).  4. Broken bone (fracture).  HOME CARE INSTRUCTIONS   1. Apply ice to the sore area.  ¨ Put ice in a plastic bag.  ¨ Place a towel between your skin and the bag.  ¨ Leave the ice on for 15-20 minutes, 3-4 times per day for the first 2 days, or as directed by your health care provider.  2. Stop using cold packs if they do not help with the pain.  3. If you have a shoulder sling or immobilizer, wear it as long as your caregiver instructs. Only remove it to shower or bathe. Move your arm as little as possible, but keep your hand moving to prevent swelling.  4. Squeeze a soft ball or foam pad as much as possible to help prevent swelling.  5. Only take  over-the-counter or prescription medicines for pain, discomfort, or fever as directed by your caregiver.  SEEK MEDICAL CARE IF:   1. Your shoulder pain increases, or new pain develops in your arm, hand, or fingers.  2. Your hand or fingers become cold and numb.  3. Your pain is not relieved with medicines.  SEEK IMMEDIATE MEDICAL CARE IF:   1. Your arm, hand, or fingers are numb or tingling.  2. Your arm, hand, or fingers are significantly swollen or turn white or blue.  MAKE SURE YOU:   1. Understand these instructions.  2. Will watch your condition.  3. Will get help right away if you are not doing well or get worse.     This information is not intended to replace advice given to you by your health care provider. Make sure you discuss any questions you have with your health care provider.     Document Released: 03/02/2005 Document Revised: 06/13/2014 Document Reviewed: 09/15/2014  Elsevier Interactive Patient Education ©2016 [employer]  Shoulder Range of Motion Exercises  Shoulder range of motion (ROM) exercises are designed to keep the shoulder moving freely. They are often recommended for people who have shoulder pain.  MOVEMENT EXERCISE  When you are able, do this exercise 5-6 days per week, or as told by your health care provider. Work toward doing 2 sets of 10 swings.  Pendulum   Exercise  How To Do This Exercise Lying Down  5. Lie face-down on a bed with your abdomen close to the side of the bed.  6. Let your arm hang over the side of the bed.  7. Relax your shoulder, arm, and hand.  8. Slowly and gently swing your arm forward and back. Do not use your neck muscles to swing your arm. They should be relaxed. If you are struggling to swing your arm, have someone gently swing it for you. When you do this exercise for the first time, swing your arm at a 15 degree angle for 15 seconds, or swing your arm 10 times. As pain lessens over time, increase the angle of the swing to 30-45 degrees.  9. Repeat steps 1-4  with the other arm.  How To Do This Exercise While Standing  6. Stand next to a sturdy chair or table and hold on to it with your hand.  ¨ Bend forward at the waist.  ¨ Bend your knees slightly.  ¨ Relax your other arm and let it hang limp.  ¨ Relax the shoulder blade of the arm that is hanging and let it drop.  ¨ While keeping your shoulder relaxed, use body motion to swing your arm in small circles. The first time you do this exercise, swing your arm for about 30 seconds or 10 times. When you do it next time, swing your arm for a little longer.  ¨ Stand up tall and relax.  ¨ Repeat steps 1-7, this time changing the direction of the circles.  7. Repeat steps 1-8 with the other arm.  STRETCHING EXERCISES  Do these exercises 3-4 times per day on 5-6 days per week or as told by your health care provider. Work toward holding the stretch for 20 seconds.  Stretching Exercise 1  4. Lift your arm straight out in front of you.  5. Bend your arm 90 degrees at the elbow (right angle) so your forearm goes across your body and looks like the letter "L."  6. Use your other arm to gently pull the elbow forward and across your body.  7. Repeat steps 1-3 with the other arm.  Stretching Exercise 2  You will need a towel or rope for this exercise.  3. Bend one arm behind your back with the palm facing outward.  4. Hold a towel with your other hand.  5. Reach the arm that holds the towel above your head, and bend that arm at the elbow. Your wrist should be behind your neck.  6. Use your free hand to grab the free end of the towel.  7. With the higher hand, gently pull the towel up behind you.  8. With the lower hand, pull the towel down behind you.  9. Repeat steps 1-6 with the other arm.  STRENGTHENING EXERCISES  Do each of these exercises at four different times of day (sessions) every day or as told by your health care provider. To begin with, repeat each exercise 5 times (repetitions). Work toward doing 3 sets of 12 repetitions or  as told by your health care provider.  Strengthening Exercise 1  You will need a light weight for this activity. As you grow stronger, you may use a heavier weight.  4. Standing with a weight in your hand, lift your arm straight out to the side until it is at the same height as your shoulder.  5. Bend your arm at 90 degrees so that your   fingers are pointing to the ceiling.  6. Slowly raise your hand until your arm is straight up in the air.  7. Repeat steps 1-3 with the other arm.  Strengthening Exercise 2  You will need a light weight for this activity. As you grow stronger, you may use a heavier weight.  1. Standing with a weight in your hand, gradually move your straight arm in an arc, starting at your side, then out in front of you, then straight up over your head.  2. Gradually move your other arm in an arc, starting at your side, then out in front of you, then straight up over your head.  3. Repeat steps 1-2 with the other arm.  Strengthening Exercise 3  You will need an elastic band for this activity. As you grow stronger, gradually increase the size of the bands or increase the number of bands that you use at one time.  1. While standing, hold an elastic band in one hand and raise that arm up in the air.  2. With your other hand, pull down the band until that hand is by your side.  3. Repeat steps 1-2 with the other arm.     This information is not intended to replace advice given to you by your health care provider. Make sure you discuss any questions you have with your health care provider.     Document Released: 02/19/2003 Document Revised: 10/07/2014 Document Reviewed: 05/19/2014  Elsevier Interactive Patient Education ©2016 [employer]

## 2015-11-12 NOTE — ED Provider Notes (Signed)
CSN: [id number]     Arrival date & time 11/12/15  1220 History   First MD Initiated Contact with Patient 11/12/15 1225     Chief Complaint  Patient presents with  . Arm Pain    Dallas BreedingDaniel Pinkard is a 50 y.o. male Who presents to the emergency department complaining of 2-1/2 weeks of right shoulder pain. Patient reports that 2-1/2 weeks ago he was lifting bags of concrete and he injured his right shoulder. He reports since then he's been having bad right shoulder pain. He reports his pain is with movement. He reports 8 out of 10 pain with movement of his right shoulder. He reports a previous injury to his humerus never required ORIF. This was done in [place] many years ago. He has taken nothing for treatment of his symptoms today. He denies fevers, numbness, tingling, weakness, chest pain, shortness of breath, palpitations, falls, or rashes.   Patient is a 50 y.o. male presenting with arm pain. The history is provided by the patient. No language interpreter was used.  Arm Pain Associated symptoms include arthralgias. Pertinent negatives include no chest pain, coughing, fever, joint swelling, neck pain, numbness or weakness.    Past Medical History  Diagnosis Date  . Diabetes mellitus without complication (HCC)   . Neuropathy [facility](HCC)    Past Surgical History  Procedure Laterality Date  . Hernia repair     Family History  Problem Relation Age of Onset  . Diabetes Brother    Social History  Substance Use Topics  . Smoking status: Current Every Day Smoker -- 1.00 packs/day    Types: Cigarettes  . Smokeless tobacco: Never Used  . Alcohol Use: No    Review of Systems  Constitutional: Negative for fever.  Respiratory: Negative for cough and shortness of breath.   Cardiovascular: Negative for chest pain.  Musculoskeletal: Positive for arthralgias. Negative for back pain, joint swelling and neck pain.  Skin: Negative for wound.  Neurological: Negative for weakness and numbness.       Allergies  Review of patient's allergies indicates no known allergies.  Home Medications   Prior to Admission medications   Medication Sig Start Date End Date Taking? Authorizing Provider  ibuprofen (ADVIL,MOTRIN) 200 MG tablet Take 400 mg by mouth every 6 (six) hours as needed.   Yes Historical Provider, MD  naproxen (NAPROSYN) 250 MG tablet Take 1 tablet (250 mg total) by mouth 2 (two) times daily with a meal. 11/12/15   Everlene FarrierWilliam Vinod Mikesell, PA-C   BP 131/85 mmHg  Pulse 93  Temp(Src) 98.3 F (36.8 C) (Oral)  Resp 22  Ht 5\' 11"  (1.803 m)  Wt 82.101 kg  BMI 25.26 kg/m2  SpO2 98% Physical Exam  Constitutional: He appears well-developed and well-nourished. No distress.  Nontoxic appearing.  HENT:  Head: Normocephalic and atraumatic.  Eyes: Right eye exhibits no discharge. Left eye exhibits no discharge.  Neck: Neck supple.  Cardiovascular: Normal rate, regular rhythm, normal heart sounds and intact distal pulses.   Heart rate is 88. Bilateral radial pulses are intact.  Pulmonary/Chest: Effort normal and breath sounds normal. No respiratory distress.  Musculoskeletal: Normal range of motion. He exhibits no edema.  No right clavicle or shoulder tenderness to palpation. Patient complains of pain with active range of motion. He is able to lift his hand and place it behind his head. No right elbow or wrist tenderness to palpation. No right shoulder edema, deformity, ecchymosis, or warmth.   Neurological: He is alert. Coordination normal.  Sensation is intact his bilateral upper extremities.  Skin: Skin is warm and dry. No rash noted. He is not diaphoretic. No erythema. No pallor.  Psychiatric: He has a normal mood and affect. His behavior is normal.  Nursing note and vitals reviewed.   ED Course  Procedures (including critical care time) Labs Review Labs Reviewed - No data to display  Imaging Review Dg Shoulder Right  11/12/2015  CLINICAL DATA:  Lifting injury a couple weeks  ago, right shoulder pain ever since. EXAM: RIGHT SHOULDER - 2+ VIEW COMPARISON:  None. FINDINGS: Intra medullary rod and fixation screw appear well-positioned within the and proximal right humerus. Right humeral head is normally positioned relative to the glenoid fossa. Overall osseous alignment is normal. Bone mineralization is normal. No fracture line or displaced fracture fragment seen. No acute or suspicious osseous lesion. No significant degenerative change. Adjacent soft tissues are unremarkable. IMPRESSION: 1. Intramedullary rod and fixation screw appropriately positioned within the proximal right humerus. No evidence of surgical complicating feature. 2. No acute findings. 3. No significant degenerative change. Electronically Signed   By: Bary Richard M.D.   On: 11/12/2015 12:54   I have personally reviewed and evaluated these images as part of my medical decision-making.   EKG Interpretation None      Filed Vitals:   11/12/15 1224 11/12/15 1313  BP: 130/86 131/85  Pulse: 115 93  Temp: 98.3 F (36.8 C)   TempSrc: Oral   Resp: 18 22  Height:  (1.803 m)   Weight: 82.101 kg   SpO2: 98% 98%     MDM   Meds given in ED:  Medications - No data to display  New Prescriptions   NAPROXEN (NAPROSYN) 250 MG TABLET    Take 1 tablet (250 mg total) by mouth 2 (two) times daily with a meal.    Final diagnoses:  Right shoulder pain   This  is a 50 y.o. male Who presents to the emergency department complaining of 2-1/2 weeks of right shoulder pain. Patient reports that 2-1/2 weeks ago he was lifting bags of concrete and he injured his right shoulder. He reports since then he's been having bad right shoulder pain. He reports his pain is with movement. He reports 8 out of 10 pain with movement of his right shoulder. He reports a previous injury to his humerus never required ORIF. On exam the patient is afebrile nontoxic appearing. He has pain with active range of motion of his right  shoulder. He has good range of motion of his right shoulder. No right shoulder deformity, edema, ecchymosis or warmth. No concern for septic joint. No overlying skin changes. He is neurovascularly intact. X-ray shows no acute findings. Will have the patient follow-up with orthopedic surgery and provided with a prescription for naproxen for pain control. I discussed return precautions. I advised the patient to follow-up with their primary care provider this week. I advised the patient to return to the emergency department with new or worsening symptoms or new concerns. The patient verbalized understanding and agreement with plan.    Everlene Farrier, PA-C 11/12/15 1331  Glynn Octave, MD 11/12/15 [phone]

## 2016-04-19 ENCOUNTER — Encounter (HOSPITAL_COMMUNITY): Payer: Self-pay

## 2016-04-19 ENCOUNTER — Emergency Department (HOSPITAL_COMMUNITY)
Admission: EM | Admit: 2016-04-19 | Discharge: 2016-04-19 | Disposition: A | Discharge status: Home / Self Care | Payer: Medicare Other | Attending: Emergency Medicine | Admitting: Emergency Medicine

## 2016-04-19 DIAGNOSIS — M79671 Pain in right foot: Secondary | ICD-10-CM | POA: Diagnosis present

## 2016-04-19 DIAGNOSIS — E1140 Type 2 diabetes mellitus with diabetic neuropathy, unspecified: Secondary | ICD-10-CM | POA: Insufficient documentation

## 2016-04-19 DIAGNOSIS — E1165 Type 2 diabetes mellitus with hyperglycemia: Secondary | ICD-10-CM | POA: Insufficient documentation

## 2016-04-19 DIAGNOSIS — Z791 Long term (current) use of non-steroidal anti-inflammatories (NSAID): Secondary | ICD-10-CM | POA: Insufficient documentation

## 2016-04-19 DIAGNOSIS — F17210 Nicotine dependence, cigarettes, uncomplicated: Secondary | ICD-10-CM | POA: Diagnosis not present

## 2016-04-19 DIAGNOSIS — G629 Polyneuropathy, unspecified: Secondary | ICD-10-CM | POA: Diagnosis not present

## 2016-04-19 DIAGNOSIS — R739 Hyperglycemia, unspecified: Secondary | ICD-10-CM

## 2016-04-19 LAB — COMPREHENSIVE METABOLIC PANEL
ALBUMIN: 4.1 g/dL (ref 3.5–5.0)
ALK PHOS: 77 U/L (ref 38–126)
ALT: 26 U/L (ref 17–63)
ANION GAP: 10 (ref 5–15)
AST: 40 U/L (ref 15–41)
BUN: 20 mg/dL (ref 6–20)
CO2: 26 mmol/L (ref 22–32)
Calcium: 9.4 mg/dL (ref 8.9–10.3)
Chloride: 98 mmol/L — ABNORMAL LOW (ref 101–111)
Creatinine, Ser: 1.03 mg/dL (ref 0.61–1.24)
GFR calc Af Amer: >60 mL/min (ref >60)
GFR calc non Af Amer: >60 mL/min (ref >60)
Glucose, Bld: 380 mg/dL — ABNORMAL HIGH (ref 65–99)
Potassium: 4.4 mmol/L (ref 3.5–5.1)
SODIUM: 134 mmol/L — AB (ref 135–145)
Total Bilirubin: 0.7 mg/dL (ref 0.3–1.2)
Total Protein: 7 g/dL (ref 6.5–8.1)

## 2016-04-19 LAB — CBC WITH DIFFERENTIAL/PLATELET
BASOS ABS: 0.1 10*3/uL (ref 0.0–0.1)
BASOS PCT: 1 %
EOS ABS: 0.1 10*3/uL (ref 0.0–0.7)
Eosinophils Relative: 1 %
HCT: 42.2 % (ref 39.0–52.0)
HEMOGLOBIN: 14.8 g/dL (ref 13.0–17.0)
Lymphocytes Relative: 36 %
Lymphs Abs: 3.3 10*3/uL (ref 0.7–4.0)
MCH: 31.1 pg (ref 26.0–34.0)
MCHC: 35.1 g/dL (ref 30.0–36.0)
MCV: 88.7 fL (ref 78.0–100.0)
MONOS PCT: 11 %
Monocytes Absolute: 1 10*3/uL (ref 0.1–1.0)
NEUTROS PCT: 51 %
Neutro Abs: 4.6 10*3/uL (ref 1.7–7.7)
Platelets: 203 10*3/uL (ref 150–400)
RBC: 4.76 MIL/uL (ref 4.22–5.81)
RDW: 14 % (ref 11.5–15.5)
WBC: 9 10*3/uL (ref 4.0–10.5)

## 2016-04-19 LAB — CBG MONITORING, ED
GLUCOSE-CAPILLARY: 422 mg/dL — AB (ref 65–99)
Glucose-Capillary: 84 mg/dL (ref 65–99)
Glucose-Capillary: 101 mg/dL — ABNORMAL HIGH (ref 65–99)

## 2016-04-19 MED ORDER — GABAPENTIN 300 MG PO CAPS
300.0000 mg | ORAL_CAPSULE | Freq: Once | ORAL | Status: AC
  Administered 2016-04-19: 300 mg via ORAL
  Filled 2016-04-19: qty 1

## 2016-04-19 MED ORDER — INSULIN ASPART 100 UNIT/ML ~~LOC~~ SOLN
12.0000 [IU] | Freq: Once | SUBCUTANEOUS | Status: AC
  Administered 2016-04-19: 12 [IU] via INTRAVENOUS
  Filled 2016-04-19: qty 1

## 2016-04-19 MED ORDER — METFORMIN HCL 1000 MG PO TABS
1000.0000 mg | ORAL_TABLET | Freq: Two times a day (BID) | ORAL | 0 refills | Status: DC
Start: 2016-04-19 — End: 2016-05-20

## 2016-04-19 MED ORDER — DIAZEPAM 5 MG/ML IJ SOLN
5.0000 mg | Freq: Once | INTRAMUSCULAR | Status: AC
  Administered 2016-04-19: 5 mg via INTRAVENOUS
  Filled 2016-04-19: qty 2

## 2016-04-19 MED ORDER — SODIUM CHLORIDE 0.9 % IV BOLUS (SEPSIS)
1000.0000 mL | Freq: Once | INTRAVENOUS | Status: AC
  Administered 2016-04-19: 1000 mL via INTRAVENOUS

## 2016-04-19 MED ORDER — MORPHINE SULFATE (PF) 4 MG/ML IV SOLN
4.0000 mg | Freq: Once | INTRAVENOUS | Status: AC
  Administered 2016-04-19: 4 mg via INTRAVENOUS
  Filled 2016-04-19: qty 1

## 2016-04-19 NOTE — ED Notes (Signed)
cbg of 101.

## 2016-04-19 NOTE — ED Notes (Signed)
One liter bolus in progress

## 2016-04-19 NOTE — ED Notes (Signed)
Pt more restful after meds given.  

## 2016-04-19 NOTE — ED Triage Notes (Signed)
Pt reports bilateral foot pain since last night.  Reports his heels are split open and bottom of his feet are black.

## 2016-04-19 NOTE — ED Provider Notes (Signed)
[facility] DEPT Provider Note   CSN: [id number] Arrival date & time: 04/19/16  0820  By signing my name below, I, Jerry Shepherd, attest that this documentation has been prepared under the direction and in the presence of Jerry RazorStephen Cookie Pore, MD . Electronically Signed: Majel HomerPeyton Shepherd, Scribe. 04/19/2016. 8:55 AM.  History   Chief Complaint Chief Complaint  Patient presents with  . Foot Pain   The history is provided by the patient. No language interpreter was used.   HPI Comments: Jerry Shepherd is a 50 y.o. male with PMHx of DM and neuropathy, who presents to the Emergency Department complaining of gradually worsening, bilateral foot pain that began last night. Pt reports his pain radiates from his bilateral heels upwards into his posterior legs. He notes his pain feels as if "his heels are splitting" and the "soles of his feet are black." He states he has applied "hand and foot cream" with no relief. Pt notes the last time he has taken any medication for his DM was last year.   Past Medical History:  Diagnosis Date  . Diabetes mellitus without complication (HCC)   . Neuropathy (HCC)    There are no active problems to display for this patient.  Past Surgical History:  Procedure Laterality Date  . HERNIA REPAIR      Home Medications    Prior to Admission medications   Medication Sig Start Date End Date Taking? Authorizing Provider  ibuprofen (ADVIL,MOTRIN) 200 MG tablet Take 400 mg by mouth every 6 (six) hours as needed.    Historical Provider, MD  naproxen (NAPROSYN) 250 MG tablet Take 1 tablet (250 mg total) by mouth 2 (two) times daily with a meal. 11/12/15   Everlene FarrierWilliam Dansie, PA-C    Family History Family History  Problem Relation Age of Onset  . Diabetes Brother     Social History Social History  Substance Use Topics  . Smoking status: Current Every Day Smoker    Packs/day: 1.00    Types: Cigarettes  . Smokeless tobacco: Never Used  . Alcohol use No     Allergies     Patient has no known allergies.   Review of Systems Review of Systems  Constitutional: Negative for fever.  Musculoskeletal: Positive for arthralgias (bilateral feet).   Physical Exam Updated Vital Signs BP 123/85 (BP Location: Left Arm)   Pulse 116   Temp 98.4 F (36.9 C) (Oral)   Resp 20   Ht 5\' 11"  (1.803 m)   Wt 180 lb (81.6 kg)   SpO2 98%   BMI 25.10 kg/m   Physical Exam  Constitutional: He is oriented to person, place, and time. He appears well-developed and well-nourished.  Looks uncomfortable.   HENT:  Head: Normocephalic.  Eyes: EOM are normal.  Neck: Normal range of motion.  Pulmonary/Chest: Effort normal.  Abdominal: He exhibits no distension.  Musculoskeletal: Normal range of motion.  Moving legs frequently. Feet are dirty, calloused with some skin cracking at heels. No swelling or erythema, feet are warm. Bilateral DP pulses, no calf tenderness.   Neurological: He is alert and oriented to person, place, and time.  Psychiatric: He has a normal mood and affect.  Nursing note and vitals reviewed.  ED Treatments / Results  Labs (all labs ordered are listed, but only abnormal results are displayed) Labs Reviewed  COMPREHENSIVE METABOLIC PANEL - Abnormal; Notable for the following:       Result Value   Sodium 134 (*)    Chloride 98 (*)  Glucose, Bld 380 (*)    All other components within normal limits  HEMOGLOBIN A1C - Abnormal; Notable for the following:    Hgb A1c MFr Bld 11.4 (*)    All other components within normal limits  CBG MONITORING, ED - Abnormal; Notable for the following:    Glucose-Capillary 422 (*)    All other components within normal limits  CBG MONITORING, ED - Abnormal; Notable for the following:    Glucose-Capillary 101 (*)    All other components within normal limits  CBC WITH DIFFERENTIAL/PLATELET  CBG MONITORING, ED    EKG  EKG Interpretation None       Radiology No results found.  Procedures Procedures (including  critical care time)  Medications Ordered in ED Medications - No data to display  DIAGNOSTIC STUDIES:  Oxygen Saturation is 98% on RA, normal by my interpretation.    COORDINATION OF CARE:  8:54 AM Discussed treatment plan with pt at bedside and pt agreed to plan.  Initial Impression / Assessment and Plan / ED Course  I have reviewed the triage vital signs and the nursing notes.  Pertinent labs & imaging results that were available during my care of the patient were reviewed by me and considered in my medical decision making (see chart for details).  Clinical Course     50 year old male with symptoms consistent with peripheral neuropathy. He has some fissures in his feet. No overt infection. His neurovascular intact. Hyperglycemia was treated with IV fluids and insulin. He is not in DKA. We'll start him back on metformin. He understands need for outpatient follow-up and that his neuropathy is more likely related to poorly controlled diabetes.  I personally performed the services described in this documentation, which was scribed in my presence. The recorded information has been reviewed and is accurate.   Final Clinical Impressions(s) / ED Diagnoses   Final diagnoses:  Hyperglycemia  Neuropathy (HCC)    New Prescriptions Discharge Medication List as of 04/19/2016 12:01 PM    START taking these medications   Details  metFORMIN (GLUCOPHAGE) 1000 MG tablet Take 1 tablet (1,000 mg total) by mouth 2 (two) times daily., Starting Tue 04/19/2016, Print         Jerry RazorStephen Dawna Jakes, MD 05/02/16 [id number]

## 2016-04-20 LAB — HEMOGLOBIN A1C
Hgb A1c MFr Bld: 11.4 % — ABNORMAL HIGH (ref 4.8–5.6)
Mean Plasma Glucose: 280 mg/dL

## 2016-05-20 ENCOUNTER — Emergency Department (HOSPITAL_COMMUNITY)
Admission: EM | Admit: 2016-05-20 | Discharge: 2016-05-20 | Disposition: A | Discharge status: Home / Self Care | Payer: Medicare Other | Attending: Emergency Medicine | Admitting: Emergency Medicine

## 2016-05-20 ENCOUNTER — Encounter (HOSPITAL_COMMUNITY): Payer: Self-pay | Admitting: Emergency Medicine

## 2016-05-20 DIAGNOSIS — M79671 Pain in right foot: Secondary | ICD-10-CM | POA: Diagnosis present

## 2016-05-20 DIAGNOSIS — E1140 Type 2 diabetes mellitus with diabetic neuropathy, unspecified: Secondary | ICD-10-CM | POA: Diagnosis not present

## 2016-05-20 DIAGNOSIS — F17210 Nicotine dependence, cigarettes, uncomplicated: Secondary | ICD-10-CM | POA: Diagnosis not present

## 2016-05-20 DIAGNOSIS — Z791 Long term (current) use of non-steroidal anti-inflammatories (NSAID): Secondary | ICD-10-CM | POA: Insufficient documentation

## 2016-05-20 DIAGNOSIS — E1121 Type 2 diabetes mellitus with diabetic nephropathy: Secondary | ICD-10-CM | POA: Diagnosis not present

## 2016-05-20 DIAGNOSIS — E1165 Type 2 diabetes mellitus with hyperglycemia: Secondary | ICD-10-CM | POA: Diagnosis not present

## 2016-05-20 DIAGNOSIS — R739 Hyperglycemia, unspecified: Secondary | ICD-10-CM

## 2016-05-20 LAB — BASIC METABOLIC PANEL
Anion gap: 7 (ref 5–15)
BUN: 20 mg/dL (ref 6–20)
CO2: 27 mmol/L (ref 22–32)
Calcium: 9.4 mg/dL (ref 8.9–10.3)
Chloride: 98 mmol/L — ABNORMAL LOW (ref 101–111)
Creatinine, Ser: 1.4 mg/dL — ABNORMAL HIGH (ref 0.61–1.24)
GFR calc Af Amer: >60 mL/min (ref >60)
GFR, EST NON AFRICAN AMERICAN: 56 mL/min — AB (ref >60)
GLUCOSE: 329 mg/dL — AB (ref 65–99)
POTASSIUM: 5.2 mmol/L — AB (ref 3.5–5.1)
Sodium: 132 mmol/L — ABNORMAL LOW (ref 135–145)

## 2016-05-20 MED ORDER — METFORMIN HCL 1000 MG PO TABS: 1000.0000 mg | ORAL_TABLET | Freq: Two times a day (BID) | ORAL | 1 refills | Status: DC

## 2016-05-20 MED ORDER — GABAPENTIN 300 MG PO CAPS
300.0000 mg | ORAL_CAPSULE | Freq: Once | ORAL | Status: AC
  Administered 2016-05-20: 300 mg via ORAL
  Filled 2016-05-20: qty 1

## 2016-05-20 MED ORDER — OXYCODONE-ACETAMINOPHEN 5-325 MG PO TABS: 1.0000 | ORAL_TABLET | ORAL | 0 refills | Status: DC | PRN

## 2016-05-20 MED ORDER — GABAPENTIN 300 MG PO CAPS: 300.0000 mg | ORAL_CAPSULE | Freq: Two times a day (BID) | ORAL | 0 refills | Status: DC

## 2016-05-20 MED ORDER — OXYCODONE-ACETAMINOPHEN 5-325 MG PO TABS
2.0000 | ORAL_TABLET | Freq: Once | ORAL | Status: AC
  Administered 2016-05-20: 2 via ORAL
  Filled 2016-05-20: qty 2

## 2016-05-20 NOTE — ED Triage Notes (Signed)
Having pain to right foot, rates pain 8/10.  History of foot pain bilaterally.  Not able to see PCP until next year.

## 2016-05-20 NOTE — Discharge Instructions (Signed)
As discussed.  Return here on Monday to have your BMP rechecked.  Try to avoid sweets.  Contact one of the providers listed in the packet given to establish a primary care.

## 2016-05-20 NOTE — ED Provider Notes (Signed)
[facility] Provider Note   CSN: [id number] Arrival date & time: 05/20/16  1137     History   Chief Complaint Chief Complaint  Patient presents with  . Foot Pain    right    HPI Jerry BreedingDaniel Follansbee is a 50 y.o. male.  HPI   Jerry BreedingDaniel Shepherd is a 50 y.o. male who presents to the Emergency Department complaining of burning stinging pain to both feet with right greater than left.  He reports hx of pre-diabetes.  He was seen here last month for same.  He was prescribed metformin and states that he completed the medication but continues to have symptoms.  Pain is constant, but worse with weight bearing.  He denies redness, swelling, or numbness of the Le's.  He has an upcoming appt with Dr. Janna Archondiego, but currently does not have PMD   Past Medical History:  Diagnosis Date  . Diabetes mellitus without complication (HCC)   . Neuropathy (HCC)     There are no active problems to display for this patient.   Past Surgical History:  Procedure Laterality Date  . HERNIA REPAIR         Home Medications    Prior to Admission medications   Medication Sig Start Date End Date Taking? Authorizing Provider  ibuprofen (ADVIL,MOTRIN) 200 MG tablet Take 400 mg by mouth every 6 (six) hours as needed.   Yes Historical Provider, MD    Family History Family History  Problem Relation Age of Onset  . Diabetes Brother     Social History Social History  Substance Use Topics  . Smoking status: Current Every Day Smoker    Packs/day: 1.00    Types: Cigarettes  . Smokeless tobacco: Never Used  . Alcohol use No     Allergies   Patient has no known allergies.   Review of Systems Review of Systems  Constitutional: Negative for chills and fever.  Musculoskeletal: Positive for arthralgias (foot pain). Negative for joint swelling.  Skin: Negative for color change and wound.  All other systems reviewed and are negative.    Physical Exam Updated Vital Signs BP (!) 129/101 (BP  Location: Left Arm)   Pulse 112   Temp 98.6 F (37 C) (Oral)   Resp 20   Ht 5\' 11"  (1.803 m)   Wt 79.4 kg   SpO2 100%   BMI 24.41 kg/m   Physical Exam  Constitutional: He is oriented to person, place, and time. He appears well-developed and well-nourished. No distress.  HENT:  Head: Normocephalic.  Mouth/Throat: Oropharynx is clear and moist.  Cardiovascular: Normal rate, regular rhythm and intact distal pulses.   Pulmonary/Chest: Effort normal. No respiratory distress.  Musculoskeletal:  No tenderness with palp or ROM of the bilateral feet.  No erythema or edema.  Distal sensation intact.  DP pulses brisk.    Neurological: He is alert and oriented to person, place, and time.  Skin: Skin is warm. Capillary refill takes less than 2 seconds. No rash noted.  Nursing note and vitals reviewed.    ED Treatments / Results  Labs (all labs ordered are listed, but only abnormal results are displayed) Labs Reviewed  BASIC METABOLIC PANEL - Abnormal; Notable for the following:       Result Value   Sodium 132 (*)    Potassium 5.2 (*)    Chloride 98 (*)    Glucose, Bld 329 (*)    Creatinine, Ser 1.40 (*)    GFR calc non Af Amer 56 (*)  All other components within normal limits    EKG  EKG Interpretation None       Radiology No results found.  Procedures Procedures (including critical care time)  Medications Ordered in ED Medications  oxyCODONE-acetaminophen (PERCOCET/ROXICET) 5-325 MG per tablet 2 tablet (2 tablets Oral Given 05/20/16 1334)  gabapentin (NEURONTIN) capsule 300 mg (300 mg Oral Given 05/20/16 1334)     Initial Impression / Assessment and Plan / ED Course  I have reviewed the triage vital signs and the nursing notes.  Pertinent labs & imaging results that were available during my care of the patient were reviewed by me and considered in my medical decision making (see chart for details).  Clinical Course     Pt labs reviewed on discussed with Dr.  Adriana Simasook.  Pt also seen by Dr. Adriana Simasook. Pt is overall well appearing with likely Diabetic neuropathy of his feet. 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 No clinical symptoms or labs that are suggestive of DKA. Patient is trying to establish primary care follow-up, I have given referral information and also recommended that he try to arrange follow-up with local endocrinology. Since patient does not have a primary care provider at this time I have asked that he return on Monday for recheck of his BMP.  He agrees to care plan and verbalized understanding. He appears stable for discharge.   Final Clinical Impressions(s) / ED Diagnoses   Final diagnoses:  Hyperglycemia  Diabetic neuropathy, painful [facility](HCC)    New Prescriptions New Prescriptions   No medications on file     Pauline Ausammy Detron Carras, PA-C 05/22/16 0057    Donnetta HutchingBrian Cook, MD 05/27/16 [phone]

## 2016-05-23 ENCOUNTER — Encounter (HOSPITAL_COMMUNITY): Payer: Self-pay | Admitting: *Deleted

## 2016-05-23 ENCOUNTER — Emergency Department (HOSPITAL_COMMUNITY)
Admission: EM | Admit: 2016-05-23 | Discharge: 2016-05-23 | Disposition: A | Discharge status: Home / Self Care | Payer: Medicare Other | Attending: Emergency Medicine | Admitting: Emergency Medicine

## 2016-05-23 DIAGNOSIS — E1142 Type 2 diabetes mellitus with diabetic polyneuropathy: Secondary | ICD-10-CM

## 2016-05-23 DIAGNOSIS — F17210 Nicotine dependence, cigarettes, uncomplicated: Secondary | ICD-10-CM | POA: Insufficient documentation

## 2016-05-23 DIAGNOSIS — Z791 Long term (current) use of non-steroidal anti-inflammatories (NSAID): Secondary | ICD-10-CM | POA: Diagnosis not present

## 2016-05-23 DIAGNOSIS — Z7984 Long term (current) use of oral hypoglycemic drugs: Secondary | ICD-10-CM | POA: Diagnosis not present

## 2016-05-23 LAB — BASIC METABOLIC PANEL
ANION GAP: 10 (ref 5–15)
BUN: 19 mg/dL (ref 6–20)
CHLORIDE: 104 mmol/L (ref 101–111)
CO2: 23 mmol/L (ref 22–32)
Calcium: 8.9 mg/dL (ref 8.9–10.3)
Creatinine, Ser: 0.83 mg/dL (ref 0.61–1.24)
Glucose, Bld: 194 mg/dL — ABNORMAL HIGH (ref 65–99)
POTASSIUM: 3.8 mmol/L (ref 3.5–5.1)
SODIUM: 137 mmol/L (ref 135–145)

## 2016-05-23 LAB — CBG MONITORING, ED: GLUCOSE-CAPILLARY: 181 mg/dL — AB (ref 65–99)

## 2016-05-23 MED ORDER — NORTRIPTYLINE HCL 25 MG PO CAPS: 25.0000 mg | ORAL_CAPSULE | Freq: Every day | ORAL | 0 refills | Status: DC

## 2016-05-23 MED ORDER — KETOROLAC TROMETHAMINE 60 MG/2ML IM SOLN
INTRAMUSCULAR | Status: AC
  Filled 2016-05-23: qty 2

## 2016-05-23 MED ORDER — KETOROLAC TROMETHAMINE 60 MG/2ML IM SOLN
60.0000 mg | Freq: Once | INTRAMUSCULAR | Status: AC
  Administered 2016-05-23: 60 mg via INTRAMUSCULAR

## 2016-05-23 MED ORDER — GABAPENTIN 300 MG PO CAPS: 300.0000 mg | ORAL_CAPSULE | Freq: Three times a day (TID) | ORAL | 0 refills | Status: DC

## 2016-05-23 NOTE — ED Provider Notes (Signed)
[facility] Provider Note   CSN: [id number] Arrival date & time: 05/23/16  [id number]   History   Chief Complaint Chief Complaint  Patient presents with  . Foot Pain    HPI Jerry BreedingDaniel Shepherd is a 50 y.o. male with a PMH of T2DM, diabetic peripheral neuropathy, and chronic pain presenting to the ED for re-check of his kidney function and for bilateral foot pain. He was seen in the ED 11/14 and had a hemoglobin A1c that was 11.4%. He was prescribed Naproxen. He was seen again in the ED on 12/15 for peripheral neuropathy. BMP showed a glucose of 329 and Cr of 1.40. He was prescribed Gabapentin 300mg  bid and Percocet. He was told to follow-up in the ED on Monday for recheck of his BMP.   He states that the Percocet has been the only thing to help his pain. He ran out of the Percocet last night and is requesting a refill. The Gabapentin is helping him sleep, but is not helping with the actual pain. He states he has had pain in his feet for years, but it has gotten progressively worse over the last two months. The pain is located in the bottoms of both his feet. The pain feels like "needles sticking in my feet". He also notes numbness and feeling like his feet are always cold. He has been putting Healing Hands and Feet cream on his feet to help with the dryness, which has been helping. He also has associated calf "soreness" bilaterally. He denies any lower extremity edema. He denies any foot trauma.  HPI  Past Medical History:  Diagnosis Date  . Diabetes mellitus without complication (HCC)   . Neuropathy (HCC)     There are no active problems to display for this patient.   Past Surgical History:  Procedure Laterality Date  . HERNIA REPAIR        Home Medications    Prior to Admission medications   Medication Sig Start Date End Date Taking? Authorizing Provider  ibuprofen (ADVIL,MOTRIN) 200 MG tablet Take 400 mg by mouth every 6 (six) hours as needed.   Yes Historical Provider, MD    metFORMIN (GLUCOPHAGE) 1000 MG tablet Take 1 tablet (1,000 mg total) by mouth 2 (two) times daily with a meal. 05/20/16  Yes Tammy Triplett, PA-C  gabapentin (NEURONTIN) 300 MG capsule Take 1 capsule (300 mg total) by mouth 3 (three) times daily. 05/23/16   Campbell StallKaty Dodd Mayo, MD  nortriptyline (PAMELOR) 25 MG capsule Take 1 capsule (25 mg total) by mouth at bedtime. 05/23/16   Campbell StallKaty Dodd Mayo, MD  oxyCODONE-acetaminophen (PERCOCET/ROXICET) 5-325 MG tablet Take 1 tablet by mouth every 4 (four) hours as needed. 05/20/16   Pauline Ausammy Triplett, PA-C    Family History Family History  Problem Relation Age of Onset  . Diabetes Brother     Social History Social History  Substance Use Topics  . Smoking status: Current Every Day Smoker    Packs/day: 1.00    Types: Cigarettes  . Smokeless tobacco: Never Used  . Alcohol use No     Allergies   Patient has no known allergies.   Review of Systems Review of Systems 10 Systems reviewed and are negative for acute change except as noted in the HPI.  Physical Exam Updated Vital Signs BP 117/80   Pulse 117   Temp 97.6 F (36.4 C) (Oral)   Resp 20   Ht 5\' 11"  (1.803 m)   Wt 79.4 kg   SpO2 96%  BMI 24.41 kg/m   Physical Exam  Constitutional:  Anxious-appearing middle aged male, in NAD  HENT:  Head: Normocephalic and atraumatic.  Eyes: Conjunctivae are normal.  Neck: Neck supple.  Cardiovascular: Normal rate and regular rhythm.   No murmur heard. Pulmonary/Chest: Effort normal and breath sounds normal. No respiratory distress.  Abdominal: Soft. There is no tenderness.  Musculoskeletal: He exhibits no edema.  Right heel with 2cm linear fissure on the lateral side, right great toe with dry skin and cracking, left heel with dry skin and cracking, toes on the bilateral feet without sensation to light touch, no erythema or drainage, 2+ DP pulses bilaterally  Neurological: He is alert.  Skin: Skin is warm and dry.  Psychiatric: He has a normal  mood and affect.  Nursing note and vitals reviewed.    ED Treatments / Results  Labs (all labs ordered are listed, but only abnormal results are displayed) Labs Reviewed  BASIC METABOLIC PANEL - Abnormal; Notable for the following:       Result Value   Glucose, Bld 194 (*)    All other components within normal limits  CBG MONITORING, ED - Abnormal; Notable for the following:    Glucose-Capillary 181 (*)    All other components within normal limits    EKG  EKG Interpretation None       Radiology No results found.  Procedures Procedures (including critical care time)  Medications Ordered in ED Medications - No data to display   Initial Impression / Assessment and Plan / ED Course  I have reviewed the triage vital signs and the nursing notes.  Pertinent lab results that were available during my care of the patient were reviewed by me and considered in my medical decision making (see chart for details).  Clinical Course    Jerry Shepherd is a 50 year old male with a PMH of T2DM and diabetic peripheral neuropathy presenting to the ED for re-check of his kidney function and for continued bilateral foot pain. He states that Percocet is the only thing that helps his pain. The Gabapentin has been helping him sleep. His Cr on 12/15 was 1.40, improved to 0.83 today. He is working on getting a primary care doctor and an endocrinologist. His history and exam are consistent with neuropathy. He does have dry skin with some small fissures on his feet. No signs of infection. His toes are numb bilaterally.  Will give Pt a dose of IM Toradol while in the ED. Will increase his Gabapentin from 300mg  twice a day to 300mg  three times a day. Will also add Nortriptyline 25mg  qhs for diabetic neuropathy and chronic pain. Will not refill his Percocet at this time. Pt should follow-up with a primary doctor and an endocrinologist for continued care.  Final Clinical Impressions(s) / ED Diagnoses   Final  diagnoses:  Diabetic polyneuropathy associated with type 2 diabetes mellitus (HCC)    New Prescriptions New Prescriptions   NORTRIPTYLINE (PAMELOR) 25 MG CAPSULE    Take 1 capsule (25 mg total) by mouth at bedtime.     Campbell StallKaty Dodd Mayo, MD 05/23/16 [id number]    Blane OharaJoshua Zavitz, MD 05/24/16 [phone]

## 2016-05-23 NOTE — ED Triage Notes (Signed)
Pt was seen in er on 12/15//2017 for bilateral feet pain, was noted to have abnormal bmp, pt states that he has not felt any better over the weekend and the pain continues, returns today for recheck of bmp and continued foot pain,

## 2016-05-23 NOTE — Discharge Instructions (Signed)
It was so nice to meet you!  Your blood sugar and your kidney function looked much better today! Please continue taking the Metformin twice a day. We would recommend getting in to see an Endocrinologist as soon as possible.   For your foot pain, we have increased your Gabapentin from 300mg  twice a day to 300mg  three times a day. We have also added a medication called Nortriptyline that works really well for nerve pain. Please take 25mg  each night.

## 2016-08-04 ENCOUNTER — Ambulatory Visit (HOSPITAL_COMMUNITY)
Admission: EM | Admit: 2016-08-04 | Discharge: 2016-08-04 | Disposition: A | Discharge status: Home / Self Care | Payer: Medicare Other | Attending: Internal Medicine | Admitting: Internal Medicine

## 2016-08-04 ENCOUNTER — Encounter (HOSPITAL_COMMUNITY): Payer: Self-pay | Admitting: *Deleted

## 2016-08-04 DIAGNOSIS — E0849 Diabetes mellitus due to underlying condition with other diabetic neurological complication: Secondary | ICD-10-CM

## 2016-08-04 DIAGNOSIS — Z760 Encounter for issue of repeat prescription: Secondary | ICD-10-CM

## 2016-08-04 DIAGNOSIS — E139 Other specified diabetes mellitus without complications: Secondary | ICD-10-CM

## 2016-08-04 DIAGNOSIS — E109 Type 1 diabetes mellitus without complications: Secondary | ICD-10-CM | POA: Diagnosis not present

## 2016-08-04 MED ORDER — METFORMIN HCL 1000 MG PO TABS: 1000.0000 mg | ORAL_TABLET | Freq: Two times a day (BID) | ORAL | 1 refills | Status: DC

## 2016-08-04 MED ORDER — GABAPENTIN 300 MG PO CAPS: ORAL_CAPSULE | ORAL | 1 refills | Status: DC

## 2016-08-04 MED ORDER — GABAPENTIN 300 MG PO CAPS: ORAL_CAPSULE | ORAL | 0 refills | Status: DC

## 2016-08-04 NOTE — ED Provider Notes (Signed)
CSN: [id number]     Arrival date & time 08/04/16  1328 History   First MD Initiated Contact with Patient 08/04/16 1402     Chief Complaint  Patient presents with  . Medication Refill   (Consider location/radiation/quality/duration/timing/severity/associated sxs/prior Treatment) Patient states he is out of pain medication for diabetic neuropathy and he is out of diabetic medicine.   The history is provided by the patient.  Medication Refill  Medications/supplies requested:  Metformin and gabapentin Reason for request:  Clinic/provider not available Medications taken before: yes - see home medications   Patient has complete original prescription information: yes     Past Medical History:  Diagnosis Date  . Diabetes mellitus without complication (HCC)   . Neuropathy [facility])    Past Surgical History:  Procedure Laterality Date  . HERNIA REPAIR     Family History  Problem Relation Age of Onset  . Diabetes Brother    Social History  Substance Use Topics  . Smoking status: Current Every Day Smoker    Packs/day: 1.00    Types: Cigarettes  . Smokeless tobacco: Never Used  . Alcohol use No    Review of Systems  Constitutional: Negative.   HENT: Negative.   Eyes: Negative.   Respiratory: Negative.   Cardiovascular: Negative.   Gastrointestinal: Negative.   Endocrine: Negative.   Genitourinary: Negative.   Musculoskeletal: Negative.   Allergic/Immunologic: Negative.   Neurological: Positive for numbness.  Hematological: Negative.   Psychiatric/Behavioral: Negative.     Allergies  Patient has no known allergies.  Home Medications   Prior to Admission medications   Medication Sig Start Date End Date Taking? Authorizing Provider  gabapentin (NEURONTIN) 300 MG capsule Take 2 po tid 08/04/16   Deatra Canter, FNP  ibuprofen (ADVIL,MOTRIN) 200 MG tablet Take 400 mg by mouth every 6 (six) hours as needed.    Historical Provider, MD  metFORMIN (GLUCOPHAGE) 1000 MG tablet  Take 1 tablet (1,000 mg total) by mouth 2 (two) times daily with a meal. 08/04/16   Deatra Canter, FNP  nortriptyline (PAMELOR) 25 MG capsule Take 1 capsule (25 mg total) by mouth at bedtime. 05/23/16   Campbell Stall, MD  oxyCODONE-acetaminophen (PERCOCET/ROXICET) 5-325 MG tablet Take 1 tablet by mouth every 4 (four) hours as needed. 05/20/16   Tammy Triplett, PA-C   Meds Ordered and Administered this Visit  Medications - No data to display  BP (!) 162/105 (BP Location: Right Arm)   Pulse 78   Temp 98.6 F (37 C) (Oral)   Resp 18   SpO2 100%  No data found.   Physical Exam  Constitutional: He appears well-developed and well-nourished.  HENT:  Head: Normocephalic.  Right Ear: External ear normal.  Left Ear: External ear normal.  Mouth/Throat: Oropharynx is clear and moist.  Eyes: Conjunctivae and EOM are normal. Pupils are equal, round, and reactive to light.  Neck: Normal range of motion. Neck supple.  Cardiovascular: Normal rate, regular rhythm and normal heart sounds.   Pulmonary/Chest: Effort normal and breath sounds normal.  Abdominal: Soft. Bowel sounds are normal.  Neurological:  Decreased sensation in LE's consistent with neuropathy.  Nursing note and vitals reviewed.   Urgent Care Course     Procedures (including critical care time)  Labs Review Labs Reviewed - No data to display  Imaging Review No results found.   Visual Acuity Review  Right Eye Distance:   Left Eye Distance:   Bilateral Distance:    Right Eye Near:  Left Eye Near:    Bilateral Near:         MDM   1. Medication refill   2. Diabetes 1.5, managed as type 2 (HCC)   3. 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  Explained he needs to get PCP to manage these conditions.  He states he was given a few phone numbers  by the front desk for PCP's and I have given him a referral as well.      Deatra CanterWilliam J Glenda Kunst, FNP 08/04/16 [phone]

## 2016-08-04 NOTE — ED Triage Notes (Signed)
Pt  Reports  He  Is  Out  Of  His  Diabetic  Medications     As   Well  As  Pain meds    He     States  He  Has  No  Primary  Care  Provider   And  [facility]  [facility]     He  States  He  Has  Been  Out of  His  meds  For  4  Days

## 2016-08-26 ENCOUNTER — Encounter (HOSPITAL_COMMUNITY): Payer: Self-pay | Admitting: Emergency Medicine

## 2016-08-26 ENCOUNTER — Ambulatory Visit (HOSPITAL_COMMUNITY)
Admission: EM | Admit: 2016-08-26 | Discharge: 2016-08-26 | Disposition: A | Discharge status: Home / Self Care | Payer: Medicare Other | Attending: Family Medicine | Admitting: Family Medicine

## 2016-08-26 DIAGNOSIS — E0841 Diabetes mellitus due to underlying condition with diabetic mononeuropathy: Secondary | ICD-10-CM | POA: Diagnosis not present

## 2016-08-26 MED ORDER — IBUPROFEN 800 MG PO TABS: 800.0000 mg | ORAL_TABLET | Freq: Three times a day (TID) | ORAL | 0 refills | Status: DC

## 2016-08-26 MED ORDER — GABAPENTIN 600 MG PO TABS: 600.0000 mg | ORAL_TABLET | Freq: Three times a day (TID) | ORAL | 0 refills | Status: DC

## 2016-08-26 NOTE — ED Triage Notes (Signed)
PT reports diabetic nerve pain in both feet. PT was given gabapentin here a few weeks ago, but it has not helped.

## 2016-08-26 NOTE — ED Provider Notes (Signed)
CSN: [id number]     Arrival date & time 08/26/16  1016 History   First MD Initiated Contact with Patient 08/26/16 1029     Chief Complaint  Patient presents with  . Foot Pain   (Consider location/radiation/quality/duration/timing/severity/associated sxs/prior Treatment) Patient c/o having diabetic neuropathy in lower extremities.  He was rx'd gabapentin at last visit and this was working and now it has stopped.  He has PCP appointment in April. He reports FSBS's are in 120's and doing fine.   The history is provided by the patient.  Foot Pain  This is a new problem. The current episode started yesterday. The problem occurs constantly. The problem has not changed since onset.   Past Medical History:  Diagnosis Date  . Diabetes mellitus without complication (HCC)   . Neuropathy [facility](HCC)    Past Surgical History:  Procedure Laterality Date  . HERNIA REPAIR     Family History  Problem Relation Age of Onset  . Diabetes Brother    Social History  Substance Use Topics  . Smoking status: Current Every Day Smoker    Packs/day: 1.00    Types: Cigarettes  . Smokeless tobacco: Never Used  . Alcohol use No    Review of Systems  Constitutional: Negative.   HENT: Negative.   Eyes: Negative.   Respiratory: Negative.   Cardiovascular: Negative.   Gastrointestinal: Negative.   Endocrine: Negative.   Genitourinary: Negative.   Musculoskeletal: Negative.   Allergic/Immunologic: Negative.   Neurological: Positive for numbness.  Hematological: Negative.   Psychiatric/Behavioral: Negative.     Allergies  Patient has no known allergies.  Home Medications   Prior to Admission medications   Medication Sig Start Date End Date Taking? Authorizing Provider  gabapentin (NEURONTIN) 300 MG capsule Take 2 po tid 08/04/16   Deatra CanterWilliam J Aza Dantes, FNP  gabapentin (NEURONTIN) 600 MG tablet Take 1 tablet (600 mg total) by mouth 3 (three) times daily. 08/26/16   Deatra CanterWilliam J Zamir Staples, FNP  ibuprofen  (ADVIL,MOTRIN) 200 MG tablet Take 400 mg by mouth every 6 (six) hours as needed.    Historical Provider, MD  ibuprofen (ADVIL,MOTRIN) 800 MG tablet Take 1 tablet (800 mg total) by mouth 3 (three) times daily. 08/26/16   Deatra CanterWilliam J Eliah Ozawa, FNP  metFORMIN (GLUCOPHAGE) 1000 MG tablet Take 1 tablet (1,000 mg total) by mouth 2 (two) times daily with a meal. 08/04/16   Deatra CanterWilliam J Isaid Salvia, FNP  nortriptyline (PAMELOR) 25 MG capsule Take 1 capsule (25 mg total) by mouth at bedtime. 05/23/16   Campbell StallKaty Dodd Mayo, MD  oxyCODONE-acetaminophen (PERCOCET/ROXICET) 5-325 MG tablet Take 1 tablet by mouth every 4 (four) hours as needed. 05/20/16   Tammy Triplett, PA-C   Meds Ordered and Administered this Visit  Medications - No data to display  BP (!) 155/95   Pulse 92   Temp 98.2 F (36.8 C) (Oral)   Resp 16   Ht 5\' 11"  (1.803 m)   Wt 182 lb (82.6 kg)   SpO2 100%   BMI 25.38 kg/m  No data found.   Physical Exam  Constitutional: He appears well-developed and well-nourished.  HENT:  Head: Normocephalic and atraumatic.  Eyes: Conjunctivae and EOM are normal. Pupils are equal, round, and reactive to light.  Neck: Normal range of motion. Neck supple.  Cardiovascular: Normal rate, regular rhythm and normal heart sounds.   Pulmonary/Chest: Effort normal and breath sounds normal.  Neurological:  LE's with diminished sensation to touch.  Nursing note and vitals reviewed.  Urgent Care Course     Procedures (including critical care time)  Labs Review Labs Reviewed - No data to display  Imaging Review No results found.   Visual Acuity Review  Right Eye Distance:   Left Eye Distance:   Bilateral Distance:    Right Eye Near:   Left Eye Near:    Bilateral Near:         MDM   1. 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  Explained importance of optimizing diabetic control.  Follow up in April with Pcp appointment.     Deatra Canter, FNP 08/26/16 1057

## 2016-09-21 ENCOUNTER — Ambulatory Visit (HOSPITAL_COMMUNITY)
Admission: EM | Admit: 2016-09-21 | Discharge: 2016-09-21 | Disposition: A | Discharge status: Home / Self Care | Payer: Medicare Other | Attending: Family Medicine | Admitting: Family Medicine

## 2016-09-21 ENCOUNTER — Encounter (HOSPITAL_COMMUNITY): Payer: Self-pay | Admitting: Emergency Medicine

## 2016-09-21 DIAGNOSIS — M79605 Pain in left leg: Secondary | ICD-10-CM

## 2016-09-21 DIAGNOSIS — G629 Polyneuropathy, unspecified: Secondary | ICD-10-CM

## 2016-09-21 DIAGNOSIS — M79604 Pain in right leg: Secondary | ICD-10-CM

## 2016-09-21 MED ORDER — CLOTRIMAZOLE-BETAMETHASONE 1-0.05 % EX CREA: TOPICAL_CREAM | CUTANEOUS | 1 refills | Status: DC

## 2016-09-21 MED ORDER — GABAPENTIN 600 MG PO TABS: 600.0000 mg | ORAL_TABLET | Freq: Three times a day (TID) | ORAL | 1 refills | Status: DC

## 2016-09-21 MED ORDER — AMITRIPTYLINE HCL 25 MG PO TABS: 25.0000 mg | ORAL_TABLET | Freq: Every day | ORAL | 1 refills | Status: DC

## 2016-09-21 NOTE — Discharge Instructions (Signed)
Continue your Neurontin at its current dose. I have ordered a prescription for medicine to be taken at night which should help you augment the effects of gabapentin.  Please call one of the clinics below for ongoing medical care.

## 2016-09-21 NOTE — ED Triage Notes (Signed)
The patient presented to the [facility] with a complaint of chronic leg and foot pain.

## 2016-09-21 NOTE — ED Provider Notes (Signed)
[facility]    CSN: [id number] Arrival date & time: 09/21/16  1326     History   Chief Complaint Chief Complaint  Patient presents with  . Foot Pain  . Leg Pain    HPI Jerry Shepherd is a 51 y.o. male.   The patient presented to the [facility] with a complaint of chronic leg and foot pain. He is been diagnosed with neuropathy in both of his legs and he started on gabapentin several weeks ago. He like to have the dose increased and he would like to find a primary care doctor. He's had no breaks on his skin of his feet but he does have thick calluses that crack periodically.      Past Medical History:  Diagnosis Date  . Diabetes mellitus without complication (HCC)   . Neuropathy     There are no active problems to display for this patient.   Past Surgical History:  Procedure Laterality Date  . HERNIA REPAIR         Home Medications    Prior to Admission medications   Medication Sig Start Date End Date Taking? Authorizing Provider  metFORMIN (GLUCOPHAGE) 1000 MG tablet Take 1 tablet (1,000 mg total) by mouth 2 (two) times daily with a meal. 08/04/16  Yes Deatra Canter, FNP  amitriptyline (ELAVIL) 25 MG tablet Take 1 tablet (25 mg total) by mouth at bedtime. 09/21/16   Elvina Sidle, MD  clotrimazole-betamethasone (LOTRISONE) cream Apply to affected area 2 times daily prn 09/21/16   Elvina Sidle, MD  gabapentin (NEURONTIN) 600 MG tablet Take 1 tablet (600 mg total) by mouth 3 (three) times daily. 09/21/16   Elvina Sidle, MD    Family History Family History  Problem Relation Age of Onset  . Diabetes Brother     Social History Social History  Substance Use Topics  . Smoking status: Current Every Day Smoker    Packs/day: 1.00    Types: Cigarettes  . Smokeless tobacco: Never Used  . Alcohol use No     Allergies   Patient has no known allergies.   Review of Systems Review of Systems  Constitutional: Negative.   Musculoskeletal: Positive  for myalgias.  All other systems reviewed and are negative.    Physical Exam Triage Vital Signs ED Triage Vitals  Enc Vitals Group     BP 09/21/16 1416 138/88     Pulse Rate 09/21/16 1416 98     Resp 09/21/16 1416 16     Temp 09/21/16 1416 97.7 F (36.5 C)     Temp Source 09/21/16 1416 Oral     SpO2 09/21/16 1416 97 %     Weight --      Height --      Head Circumference --      Peak Flow --      Pain Score 09/21/16 1415 8     Pain Loc --      Pain Edu? --      Excl. in GC? --    No data found.   Updated Vital Signs BP 138/88 (BP Location: Right Arm)   Pulse 98   Temp 97.7 F (36.5 C) (Oral)   Resp 16   SpO2 97%    Physical Exam  Constitutional: He is oriented to person, place, and time. He appears well-developed and well-nourished.  HENT:  Right Ear: External ear normal.  Left Ear: External ear normal.  Mouth/Throat: Oropharynx is clear and moist.  Eyes: Conjunctivae and  EOM are normal. Pupils are equal, round, and reactive to light.  Neck: Normal range of motion. Neck supple.  Pulmonary/Chest: Effort normal.  Musculoskeletal: Normal range of motion.  Neurological: He is alert and oriented to person, place, and time.  Skin: Skin is warm and dry.  Nursing note and vitals reviewed.    UC Treatments / Results  Labs (all labs ordered are listed, but only abnormal results are displayed) Labs Reviewed - No data to display  EKG  EKG Interpretation None       Radiology No results found.  Procedures Procedures (including critical care time)  Medications Ordered in UC Medications - No data to display   Initial Impression / Assessment and Plan / UC Course  I have reviewed the triage vital signs and the nursing notes.  Pertinent labs & imaging results that were available during my care of the patient were reviewed by me and considered in my medical decision making (see chart for details).     Final Clinical Impressions(s) / UC Diagnoses   Final  diagnoses:  Pain in both lower extremities  Neuropathy    New Prescriptions New Prescriptions   AMITRIPTYLINE (ELAVIL) 25 MG TABLET    Take 1 tablet (25 mg total) by mouth at bedtime.   CLOTRIMAZOLE-BETAMETHASONE (LOTRISONE) CREAM    Apply to affected area 2 times daily prn   GABAPENTIN (NEURONTIN) 600 MG TABLET    Take 1 tablet (600 mg total) by mouth 3 (three) times daily.     Elvina Sidle, MD 09/21/16 [phone]

## 2016-09-28 ENCOUNTER — Ambulatory Visit (HOSPITAL_COMMUNITY)
Admission: EM | Admit: 2016-09-28 | Discharge: 2016-09-28 | Disposition: A | Discharge status: Home / Self Care | Payer: Medicare Other | Attending: Family Medicine | Admitting: Family Medicine

## 2016-09-28 ENCOUNTER — Encounter (HOSPITAL_COMMUNITY): Payer: Self-pay | Admitting: Family Medicine

## 2016-09-28 DIAGNOSIS — E0841 Diabetes mellitus due to underlying condition with diabetic mononeuropathy: Secondary | ICD-10-CM | POA: Diagnosis not present

## 2016-09-28 MED ORDER — AMITRIPTYLINE HCL 50 MG PO TABS: 50.0000 mg | ORAL_TABLET | Freq: Every day | ORAL | 1 refills | Status: DC

## 2016-09-28 MED ORDER — METFORMIN HCL 1000 MG PO TABS: 1000.0000 mg | ORAL_TABLET | Freq: Two times a day (BID) | ORAL | 1 refills | Status: DC

## 2016-09-28 MED ORDER — GABAPENTIN 600 MG PO TABS: 600.0000 mg | ORAL_TABLET | Freq: Three times a day (TID) | ORAL | 1 refills | Status: DC

## 2016-09-28 NOTE — ED Provider Notes (Signed)
[facility]    CSN: [id number] Arrival date & time: 09/28/16  1334     History   Chief Complaint Chief Complaint  Patient presents with  . BLE pain    HPI Jerry Shepherd is a 51 y.o. male.   Pt states he was here a week ago for the same issue.  States the medication he is taking is not working and was wanting to see if we could increase his dosage.  Patient was told last week that he is on maximum dose of gabapentin.  He was started on Elavil to see if this would help his neuropathy pain, and he has been able to sleep a bit better.  He told the nurse he has an appointment on May 15th with a [facility], but this appointment is not seen in the system.  He agrees to call for another appointment.      Past Medical History:  Diagnosis Date  . Diabetes mellitus without complication (HCC)   . Neuropathy     There are no active problems to display for this patient.   Past Surgical History:  Procedure Laterality Date  . HERNIA REPAIR         Home Medications    Prior to Admission medications   Medication Sig Start Date End Date Taking? Authorizing Provider  amitriptyline (ELAVIL) 50 MG tablet Take 1 tablet (50 mg total) by mouth at bedtime. 09/28/16   Elvina Sidle, MD  gabapentin (NEURONTIN) 600 MG tablet Take 1 tablet (600 mg total) by mouth 3 (three) times daily. 09/28/16   Elvina Sidle, MD  metFORMIN (GLUCOPHAGE) 1000 MG tablet Take 1 tablet (1,000 mg total) by mouth 2 (two) times daily with a meal. 09/28/16   Elvina Sidle, MD    Family History Family History  Problem Relation Age of Onset  . Diabetes Brother     Social History Social History  Substance Use Topics  . Smoking status: Current Every Day Smoker    Packs/day: 1.00    Types: Cigarettes  . Smokeless tobacco: Never Used  . Alcohol use No     Allergies   Patient has no known allergies.   Review of Systems Review of Systems  Musculoskeletal: Positive for gait problem.    Neurological: Positive for numbness.  All other systems reviewed and are negative.    Physical Exam Triage Vital Signs ED Triage Vitals  Enc Vitals Group     BP      Pulse      Resp      Temp      Temp src      SpO2      Weight      Height      Head Circumference      Peak Flow      Pain Score      Pain Loc      Pain Edu?      Excl. in GC?    No data found.   Updated Vital Signs BP (!) 138/93 (BP Location: Left Arm)   Pulse (!) 112   Temp 98.1 F (36.7 C) (Oral)   SpO2 98%    Physical Exam  Constitutional: He is oriented to person, place, and time. He appears well-developed and well-nourished.  HENT:  Right Ear: External ear normal.  Left Ear: External ear normal.  Mouth/Throat: Oropharynx is clear and moist.  Eyes: Conjunctivae are normal. Pupils are equal, round, and reactive to light.  Neck: Normal range  of motion. Neck supple.  Pulmonary/Chest: Effort normal.  Musculoskeletal: Normal range of motion.  Neurological: He is alert and oriented to person, place, and time.  Skin: Skin is warm and dry.  Nursing note and vitals reviewed.    UC Treatments / Results  Labs (all labs ordered are listed, but only abnormal results are displayed) Labs Reviewed - No data to display  EKG  EKG Interpretation None       Radiology No results found.  Procedures Procedures (including critical care time)  Medications Ordered in UC Medications - No data to display   Initial Impression / Assessment and Plan / UC Course  I have reviewed the triage vital signs and the nursing notes.  Pertinent labs & imaging results that were available during my care of the patient were reviewed by me and considered in my medical decision making (see chart for details).   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     Elvina Sidle, MD 09/28/16 [phone]

## 2016-09-28 NOTE — ED Triage Notes (Signed)
Pt states he was here a week ago for the same issue.  States the medication he is taking is not working and was wanting to see if we could increase his dosage.

## 2016-09-28 NOTE — Discharge Instructions (Signed)
Please call for an appointment with a doctor's note below. Please do this today.

## 2016-10-04 ENCOUNTER — Ambulatory Visit (INDEPENDENT_AMBULATORY_CARE_PROVIDER_SITE_OTHER): Payer: Medicare Other | Admitting: Family Medicine

## 2016-10-04 ENCOUNTER — Ambulatory Visit (INDEPENDENT_AMBULATORY_CARE_PROVIDER_SITE_OTHER): Payer: Medicare Other

## 2016-10-04 ENCOUNTER — Encounter: Payer: Self-pay | Admitting: Family Medicine

## 2016-10-04 VITALS — BP 120/78 | HR 100 | Temp 97.8°F | Ht 71.0 in | Wt 186.6 lb

## 2016-10-04 DIAGNOSIS — F321 Major depressive disorder, single episode, moderate: Secondary | ICD-10-CM

## 2016-10-04 DIAGNOSIS — G8929 Other chronic pain: Secondary | ICD-10-CM

## 2016-10-04 DIAGNOSIS — R6882 Decreased libido: Secondary | ICD-10-CM | POA: Diagnosis not present

## 2016-10-04 DIAGNOSIS — M545 Low back pain, unspecified: Secondary | ICD-10-CM

## 2016-10-04 DIAGNOSIS — G63 Polyneuropathy in diseases classified elsewhere: Secondary | ICD-10-CM | POA: Diagnosis not present

## 2016-10-04 DIAGNOSIS — M47816 Spondylosis without myelopathy or radiculopathy, lumbar region: Secondary | ICD-10-CM | POA: Diagnosis not present

## 2016-10-04 DIAGNOSIS — Z79899 Other long term (current) drug therapy: Secondary | ICD-10-CM | POA: Diagnosis not present

## 2016-10-04 DIAGNOSIS — E1149 Type 2 diabetes mellitus with other diabetic neurological complication: Secondary | ICD-10-CM

## 2016-10-04 DIAGNOSIS — E1142 Type 2 diabetes mellitus with diabetic polyneuropathy: Secondary | ICD-10-CM

## 2016-10-04 DIAGNOSIS — F17200 Nicotine dependence, unspecified, uncomplicated: Secondary | ICD-10-CM | POA: Insufficient documentation

## 2016-10-04 HISTORY — DX: Polyneuropathy in diseases classified elsewhere: G63

## 2016-10-04 HISTORY — DX: Major depressive disorder, single episode, moderate: F32.1

## 2016-10-04 HISTORY — DX: Low back pain, unspecified: M54.50

## 2016-10-04 HISTORY — DX: Type 2 diabetes mellitus with other diabetic neurological complication: E11.49

## 2016-10-04 HISTORY — DX: Other chronic pain: G89.29

## 2016-10-04 HISTORY — DX: Decreased libido: R68.82

## 2016-10-04 HISTORY — DX: Nicotine dependence, unspecified, uncomplicated: F17.200

## 2016-10-04 LAB — COMPREHENSIVE METABOLIC PANEL
ALT: 46 U/L (ref 0–53)
AST: 44 U/L — ABNORMAL HIGH (ref 0–37)
Albumin: 4.4 g/dL (ref 3.5–5.2)
Alkaline Phosphatase: 66 U/L (ref 39–117)
BUN: 13 mg/dL (ref 6–23)
CO2: 27 mEq/L (ref 19–32)
Calcium: 9.5 mg/dL (ref 8.4–10.5)
Chloride: 105 mEq/L (ref 96–112)
Creatinine, Ser: 1.07 mg/dL (ref 0.40–1.50)
GFR: 76.75 mL/min (ref >60.00)
Glucose, Bld: 194 mg/dL — ABNORMAL HIGH (ref 70–99)
Potassium: 4.3 mEq/L (ref 3.5–5.1)
Sodium: 139 mEq/L (ref 135–145)
Total Bilirubin: 0.5 mg/dL (ref 0.2–1.2)
Total Protein: 6.8 g/dL (ref 6.0–8.3)

## 2016-10-04 LAB — LIPID PANEL
Cholesterol: 167 mg/dL (ref 0–200)
HDL: 39.7 mg/dL (ref >39.00)
LDL Cholesterol: 89 mg/dL (ref 0–99)
NonHDL: 126.82
Total CHOL/HDL Ratio: 4
Triglycerides: 188 mg/dL — ABNORMAL HIGH (ref 0.0–149.0)
VLDL: 37.6 mg/dL (ref 0.0–40.0)

## 2016-10-04 LAB — MICROALBUMIN / CREATININE URINE RATIO
Creatinine,U: 156.3 mg/dL
Microalb Creat Ratio: 0.4 mg/g (ref 0.0–30.0)
Microalb, Ur: <0.7 mg/dL (ref 0.0–1.9)

## 2016-10-04 LAB — CBC
HCT: 44.4 % (ref 39.0–52.0)
Hemoglobin: 15.4 g/dL (ref 13.0–17.0)
MCHC: 34.8 g/dL (ref 30.0–36.0)
MCV: 89.4 fl (ref 78.0–100.0)
Platelets: 218 10*3/uL (ref 150.0–400.0)
RBC: 4.96 Mil/uL (ref 4.22–5.81)
RDW: 15 % (ref 11.5–15.5)
WBC: 6.2 10*3/uL (ref 4.0–10.5)

## 2016-10-04 LAB — TSH: TSH: 0.74 u[IU]/mL (ref 0.35–4.50)

## 2016-10-04 LAB — VITAMIN B12: Vitamin B-12: 448 pg/mL (ref 211–911)

## 2016-10-04 LAB — POCT GLYCOSYLATED HEMOGLOBIN (HGB A1C): Hemoglobin A1C: 6.6

## 2016-10-04 MED ORDER — GABAPENTIN 800 MG PO TABS: 800.0000 mg | ORAL_TABLET | Freq: Three times a day (TID) | ORAL | 1 refills | Status: DC

## 2016-10-04 NOTE — Progress Notes (Deleted)
Jerry Shepherd is a 51 y.o. male is here to [facility].   History of Present Illness:   Insurance claims handler, CMA, acting as scribe for Dr. Earlene Plater.  HPI  Health Maintenance Due  Topic Date Due  . HEMOGLOBIN A1C  [birth year]  . Hepatitis C Screening  1965-10-31  . PNEUMOCOCCAL POLYSACCHARIDE VACCINE (1) 06/14/1965  . FOOT EXAM  06/14/1973  . OPHTHALMOLOGY EXAM  06/14/1973  . URINE MICROALBUMIN  06/14/1973  . HIV Screening  06/14/1978  . TETANUS/TDAP  06/14/1982  . COLONOSCOPY  06/14/2013    PMHx, SurgHx, SocialHx, Medications, and Allergies were reviewed in the Visit Navigator and updated as appropriate.   Past Medical History:  Diagnosis Date  . Diabetes mellitus without complication (HCC)   . Neuropathy     Past Surgical History:  Procedure Laterality Date  . HERNIA REPAIR      Family History  Problem Relation Age of Onset  . Diabetes Brother    Social History  Substance Use Topics  . Smoking status: Current Every Day Smoker    Packs/day: 1.00    Types: Cigarettes  . Smokeless tobacco: Never Used  . Alcohol use No   Current Medications and Allergies:   Current Outpatient Prescriptions:  .  amitriptyline (ELAVIL) 50 MG tablet, Take 1 tablet (50 mg total) by mouth at bedtime., Disp: 30 tablet, Rfl: 1 .  DULoxetine (CYMBALTA) 30 MG capsule, TK 3 CS PO ONCE A DAY, Disp: , Rfl: 0 .  gabapentin (NEURONTIN) 800 MG tablet, TK 1 T PO TID, Disp: , Rfl: 0 .  metFORMIN (GLUCOPHAGE) 1000 MG tablet, Take 1 tablet (1,000 mg total) by mouth 2 (two) times daily with a meal., Disp: 60 tablet, Rfl: 1 .  traZODone (DESYREL) 50 MG tablet, TK 1 T PO QHS PRN, Disp: , Rfl: 0 No Known Allergies Review of Systems:   ROS  Vitals:   Vitals:   10/04/16 0900  BP: 120/78  Pulse: 100  Temp: 97.8 F (36.6 C)  TempSrc: Oral  SpO2: 97%  Weight: 186 lb 9.6 oz (84.6 kg)  Height:  (1.803 m)     Body mass index is 26.03 kg/m.  Physical Exam:   Physical Exam  Results for  orders placed or performed during the hospital encounter of 05/23/16  Basic metabolic panel  Result Value Ref Range   Sodium 137 135 - 145 mmol/L   Potassium 3.8 3.5 - 5.1 mmol/L   Chloride 104 101 - 111 mmol/L   CO2 23 22 - 32 mmol/L   Glucose, Bld 194 (H) 65 - 99 mg/dL   BUN 19 6 - 20 mg/dL   Creatinine, Ser 4.09 0.61 - 1.24 mg/dL   Calcium 8.9 8.9 - 81.1 mg/dL   GFR calc non Af Amer >60 >60 mL/min   GFR calc Af Amer >60 >60 mL/min   Anion gap 10 5 - 15  POC CBG, ED  Result Value Ref Range   Glucose-Capillary 181 (H) 65 - 99 mg/dL   Assessment and Plan:   There are no diagnoses linked to this encounter.  . Reviewed expectations re: course of current medical issues. . Discussed self-management of symptoms. . Outlined signs and symptoms indicating need for more acute intervention. . Patient verbalized understanding and all questions were answered. . See orders for this visit as documented in the electronic medical record. . Patient received an After Visit Summary.  Records requested if needed. I spent ** minutes with this patient, greater than  50% was face-to-face time counseling regarding the above diagnoses.  CMA served as Neurosurgeon during this visit. History, Physical, and Plan performed by medical provider. Documentation and orders reviewed and attested to. Helane Rima, D.O.  Helane Rima, D.O. , [facility] 10/04/2016  Follow-up: No Follow-up on file.  @  ***Did you take ownership of the note?

## 2016-10-04 NOTE — Progress Notes (Signed)
Jerry Shepherd is a 51 y.o. male is here to [facility].   History of Present Illness:   Jerry Shepherd CMA acting as scribe for Dr. Earlene Plater.  CC: Patient comes in today to establish care. He states that he is having pain in both legs and feet. Patient says that he feels like there are pin and needles in his feet. HE also has calf pain in both legs. He has chronic lower back pain that has been going on for two years.   Depression       The patient presents with depression.  This is a chronic problem.  The current episode started more than 1 year ago.   The onset quality is gradual.   The problem occurs every several days.The problem is unchanged.  Associated symptoms include fatigue, appetite change and sad.     The symptoms are aggravated by family issues and social issues.  Past treatments include SSRIs - Selective serotonin reuptake inhibitors.  Compliance with treatment is poor.  Previous treatment provided no relief relief.  Past medical history includes depression.   Diabetes  He has type 2 diabetes mellitus. His disease course has been improving. There are no hypoglycemic associated symptoms. Pertinent negatives for hypoglycemia include no dizziness. Associated symptoms include fatigue, foot paresthesias and visual change. Pertinent negatives for diabetes include no blurred vision, no chest pain, no foot ulcerations, no polydipsia, no polyphagia, no polyuria and no weakness. 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 Current diabetic treatment includes diet and oral agent (monotherapy). He is compliant with treatment most of the time. His weight is decreasing steadily. He is following a diabetic diet. Meal planning includes avoidance of concentrated sweets. He has had a previous visit with a dietitian. He participates in exercise intermittently. An ACE inhibitor/angiotensin II receptor blocker is not being taken. He does not see a podiatrist.Eye exam is not current.     SEE A/P FOR PROBLEM BASED CHARTING OF OTHER ISSUES.  Health Maintenance Due  Topic Date Due  . HEMOGLOBIN A1C  Apr 09, 1964  . Hepatitis C Screening  23-Oct-1965  . PNEUMOCOCCAL POLYSACCHARIDE VACCINE (1) 06/14/1965  . FOOT EXAM  06/14/1973  . OPHTHALMOLOGY EXAM  06/14/1973  . URINE MICROALBUMIN  06/14/1973  . HIV Screening  06/14/1978  . TETANUS/TDAP  06/14/1982  . COLONOSCOPY  06/14/2013   PMHx, SurgHx, SocialHx, Medications, and Allergies were reviewed in the Visit Navigator and updated as appropriate.   Past Medical History:  Diagnosis Date  . Chronic midline low back pain 10/04/2016  . Decreased libido 10/04/2016  . Depression   . Depression, major, single episode, moderate (HCC) 10/04/2016  . 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 Polyneuropathy associated with underlying disease (HCC) 10/04/2016  . Smoker 10/04/2016   Past Surgical History:  Procedure Laterality Date  . HERNIA REPAIR     Family History  Problem Relation Age of Onset  . Diabetes Brother   . Cancer Brother   . Heart disease Father    Social History  Substance Use Topics  . Smoking status: Current Every Day Smoker    Packs/day: 1.00    Years: 20.00    Types: Cigarettes  . Smokeless tobacco: Never Used  . Alcohol use No   Current Medications and Allergies:   .  amitriptyline (ELAVIL) 50 MG tablet, Take 1 tablet (50 mg total) by mouth at bedtime., Disp: 30 tablet, Rfl: 1 .  DULoxetine (CYMBALTA) 30 MG capsule, TK 3 CS  PO ONCE A DAY, Disp: , Rfl: 0 .  gabapentin (NEURONTIN) 800 MG tablet, TK 1 T PO TID, Disp: , Rfl: 0 .  metFORMIN (GLUCOPHAGE) 1000 MG tablet, Take 1 tablet (1,000 mg total) by mouth 2 (two) times daily with a meal., Disp: 60 tablet, Rfl: 1 .  traZODone (DESYREL) 50 MG tablet, TK 1 T PO QHS PRN, Disp: , Rfl: 0  No Known Allergies   Review of Systems:   Review of Systems  Constitutional: Positive for appetite change and fatigue. Negative for  chills, fever and malaise/fatigue.  HENT: Negative for congestion, ear pain, sinus pain and sore throat.   Eyes: Negative for blurred vision and double vision.  Respiratory: Negative for cough, shortness of breath and wheezing.   Cardiovascular: Negative for chest pain, palpitations and leg swelling.  Gastrointestinal: Negative for abdominal pain, constipation and diarrhea.  Genitourinary: Positive for impotence. Negative for dysuria.  Musculoskeletal: Positive for back pain. Negative for joint pain and neck pain.       Lower back pain for two years. Not being treated for this.   Skin: Negative for itching and rash.  Neurological: Negative for dizziness and weakness.  Endo/Heme/Allergies: Negative for polydipsia and polyphagia.  Psychiatric/Behavioral: Negative for depression, hallucinations and memory loss.    Vitals:   Vitals:   10/04/16 0900  BP: 120/78  Pulse: 100  Temp: 97.8 F (36.6 C)  TempSrc: Oral  SpO2: 97%  Weight: 186 lb 9.6 oz (84.6 kg)  Height:  (1.803 m)     Body mass index is 26.03 kg/m.  Physical Exam:   Physical Exam  Constitutional: He is oriented to person, place, and time. He appears well-developed and well-nourished. No distress.  HENT:  Head: Normocephalic and atraumatic.  Right Ear: External ear normal.  Left Ear: External ear normal.  Nose: Nose normal.  Mouth/Throat: Oropharynx is clear and moist.  Eyes: Conjunctivae and EOM are normal. Pupils are equal, round, and reactive to light.  Neck: Normal range of motion. Neck supple.  Cardiovascular: Normal rate, regular rhythm, normal heart sounds and intact distal pulses.   Pulses:      Dorsalis pedis pulses are 2+ on the right side, and 2+ on the left side.       Posterior tibial pulses are 2+ on the right side, and 2+ on the left side.  Pulmonary/Chest: Effort normal and breath sounds normal.  Abdominal: Soft. Bowel sounds are normal.  Musculoskeletal: He exhibits no edema.       Lumbar  back: He exhibits decreased range of motion and tenderness.       Right foot: There is normal range of motion and no deformity.       Left foot: There is normal range of motion and no deformity.  Feet:  Right Foot:  Skin Integrity: Negative for skin breakdown, erythema, callus or dry skin.  Left Foot:  Skin Integrity: Negative for skin breakdown, erythema, callus or dry skin.  Neurological: He is alert and oriented to person, place, and time.  Skin: Skin is warm and dry.  Psychiatric: He has a normal mood and affect. His behavior is normal. Judgment and thought content normal.  Nursing note and vitals reviewed.   Results for orders placed or performed in visit on 10/04/16  POCT glycosylated hemoglobin (Hb A1C)  Result Value Ref Range   Hemoglobin A1C 6.6    Assessment and Plan:   Baraka was seen today for establish care, depression and diabetes.  Diagnoses  and all orders for this visit:  DM (diabetes mellitus), type 2 with neurological complications (HCC) Comments: A1c 6.6 today. Labs pending. Orders: -     POCT glycosylated hemoglobin (Hb A1C) -     CBC -     Comprehensive metabolic panel -     Lipid panel -     TSH -     Microalbumin / creatinine urine ratio  Depression, major, single episode, moderate (HCC) Comments: Improved on Cymbalta, but not to patient's satisfaction. No SI/HI.  Smoker Comments: I advised patient to quit smoking, and offered support. [place] QUITLINE: 1-800-QUIT-NOW 301-410-9240). He is interested in trying nicotine patches.  Polyneuropathy associated with underlying disease (HCC) -     Vitamin B12       Orders: -     gabapentin (NEURONTIN) 800 MG tablet; Take 1 tablet (800 mg total) by mouth 3 (three) times daily.  Decreased libido Comments: Chronic. Associated with erectile disfunction.   Chronic midline low back pain, with sciatica presence unspecified Comments: Imaging today pending. Pain is chronic. 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 Midline. He sometimes feels LE weakness and numbness bilaterally. No imaging. No incontinence. No saddle anesthesia. Orders: -     DG Lumbar Spine 2-3 Views; Future  Encounter for long-term (current) use of medications Comments: Long-term use of Metformin puts him at risk for B12 deficiency. Will check with worsening peripheral neuropathy. Orders: -     Vitamin B12    . Reviewed expectations re: course of current medical issues. . Discussed self-management of symptoms. . Outlined signs and symptoms indicating need for more acute intervention. . Patient verbalized understanding and all questions were answered. . See orders for this visit as documented in the electronic medical record. . Patient received an After Visit Summary.  Records requested if needed. I spent 60 minutes with this patient, greater than 50% was face-to-face time counseling regarding the above diagnoses.  CMA served as Neurosurgeon during this visit. History, Physical, and Plan performed by medical provider. Documentation and orders reviewed and attested to. Helane Rima, D.O.  Helane Rima, D.O. , [facility] 10/04/2016

## 2016-10-15 ENCOUNTER — Encounter (HOSPITAL_COMMUNITY): Payer: Self-pay

## 2016-10-15 ENCOUNTER — Emergency Department (HOSPITAL_COMMUNITY): Payer: Medicare Other

## 2016-10-15 ENCOUNTER — Emergency Department (HOSPITAL_COMMUNITY)
Admission: EM | Admit: 2016-10-15 | Discharge: 2016-10-15 | Disposition: A | Discharge status: Home / Self Care | Payer: Medicare Other | Attending: Emergency Medicine | Admitting: Emergency Medicine

## 2016-10-15 DIAGNOSIS — Z79899 Other long term (current) drug therapy: Secondary | ICD-10-CM | POA: Insufficient documentation

## 2016-10-15 DIAGNOSIS — S6992XA Unspecified injury of left wrist, hand and finger(s), initial encounter: Secondary | ICD-10-CM | POA: Diagnosis not present

## 2016-10-15 DIAGNOSIS — Y9389 Activity, other specified: Secondary | ICD-10-CM | POA: Insufficient documentation

## 2016-10-15 DIAGNOSIS — W228XXA Striking against or struck by other objects, initial encounter: Secondary | ICD-10-CM | POA: Insufficient documentation

## 2016-10-15 DIAGNOSIS — Y999 Unspecified external cause status: Secondary | ICD-10-CM | POA: Diagnosis not present

## 2016-10-15 DIAGNOSIS — E119 Type 2 diabetes mellitus without complications: Secondary | ICD-10-CM | POA: Diagnosis not present

## 2016-10-15 DIAGNOSIS — S60112A Contusion of left thumb with damage to nail, initial encounter: Secondary | ICD-10-CM | POA: Insufficient documentation

## 2016-10-15 DIAGNOSIS — Y929 Unspecified place or not applicable: Secondary | ICD-10-CM | POA: Insufficient documentation

## 2016-10-15 DIAGNOSIS — M79645 Pain in left finger(s): Secondary | ICD-10-CM | POA: Diagnosis not present

## 2016-10-15 DIAGNOSIS — Z7984 Long term (current) use of oral hypoglycemic drugs: Secondary | ICD-10-CM | POA: Diagnosis not present

## 2016-10-15 DIAGNOSIS — S60012A Contusion of left thumb without damage to nail, initial encounter: Secondary | ICD-10-CM

## 2016-10-15 DIAGNOSIS — S6010XA Contusion of unspecified finger with damage to nail, initial encounter: Secondary | ICD-10-CM

## 2016-10-15 DIAGNOSIS — F17210 Nicotine dependence, cigarettes, uncomplicated: Secondary | ICD-10-CM | POA: Diagnosis not present

## 2016-10-15 NOTE — ED Notes (Signed)
Patient observed walking out. Asked where he was going and he stated "I am leaving, I am not staying here anymore." Insisted he wait for the doctor to come back and discuss discharge plans. Patient said "leave me along." Attempted to get him to sign out AMA and he refused stating "I am not signing a damn thing" and kept walking.

## 2016-10-15 NOTE — Discharge Instructions (Signed)
Keep wound clean and dry. Place topical neosporin and bandaid over the finger while the hole is healing. Ice and elevate the finger to help with pain and swelling. Alternate between tylenol and motrin as needed for pain. Followup with your Primary Care doctor in 3-5 days for recheck. Monitor area for signs of infection to include, but not limited to: increasing pain, spreading redness, drainage/pus, worsening swelling, or fevers. Return to emergency department for emergent changing or worsening symptoms.

## 2016-10-15 NOTE — ED Triage Notes (Signed)
Pt reports hitting his left thumb w/ hammer yesterday. Ecchymosis in nail bed observed and sensation intact.

## 2016-10-15 NOTE — ED Provider Notes (Signed)
[facility]-EMERGENCY DEPT Provider Note   CSN: [id number] Arrival date & time: 10/15/16  0049     History   Chief Complaint Chief Complaint  Patient presents with  . Finger Injury    Left Thumb    HPI Jerry BreedingDaniel Shepherd is a 51 y.o. male with a PMHx of chronic back pain, depression, DM2, polyneuropathy, and tobacco user, who presents to the ED with complaints of left thumb injury that occurred yesterday. Patient states that he accidentally struck his thumb with a hammer. He describes his pain as 9/10 constant throbbing nonradiating left thumb tip pain, worse with applying pressure to the thumb, and with no treatments tried prior to arrival. He reports associated bruising under the nail and swelling to the finger tip. Denies any other injuries sustained, denies any cuts or bleeding, warmth or erythema/red streaking from the area, fevers, numbness, tingling, focal weakness, loss of range of motion, or any other complaints at this time.   The history is provided by the patient and medical records. No language interpreter was used.  Hand Pain  This is a new problem. The current episode started yesterday. The problem occurs constantly. The problem has not changed since onset.Exacerbated by: pressure to the thumb. Nothing relieves the symptoms. He has tried nothing for the symptoms. The treatment provided no relief.    Past Medical History:  Diagnosis Date  . Chronic midline low back pain 10/04/2016  . Decreased libido 10/04/2016  . Depression   . Depression, major, single episode, moderate (HCC) 10/04/2016  . 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 Polyneuropathy associated with underlying disease (HCC) 10/04/2016  . Smoker 10/04/2016    Patient Active Problem List   Diagnosis Date Noted  . Polyneuropathy associated with underlying disease (HCC) 10/04/2016  . Smoker 10/04/2016  . Depression, major, single episode, moderate (HCC) 10/04/2016  . DM (diabetes  mellitus), type 2 with neurological complications (HCC) 10/04/2016  . Decreased libido 10/04/2016  . Chronic midline low back pain 10/04/2016    Past Surgical History:  Procedure Laterality Date  . HERNIA REPAIR         Home Medications    Prior to Admission medications   Medication Sig Start Date End Date Taking? Authorizing Provider  amitriptyline (ELAVIL) 50 MG tablet Take 1 tablet (50 mg total) by mouth at bedtime. 09/28/16   Elvina SidleLauenstein, Kurt, MD  DULoxetine (CYMBALTA) 30 MG capsule TK 3 CS PO ONCE A DAY 09/13/16   [provider]  gabapentin (NEURONTIN) 800 MG tablet Take 1 tablet (800 mg total) by mouth 3 (three) times daily. 10/04/16   Helane RimaWallace, Erica, DO  metFORMIN (GLUCOPHAGE) 1000 MG tablet Take 1 tablet (1,000 mg total) by mouth 2 (two) times daily with a meal. 09/28/16   Elvina SidleLauenstein, Kurt, MD  traZODone (DESYREL) 50 MG tablet TK 1 T PO QHS PRN 09/13/16   [provider]    Family History Family History  Problem Relation Age of Onset  . Diabetes Brother   . Cancer Brother   . Heart disease Father     Social History Social History  Substance Use Topics  . Smoking status: Current Every Day Smoker    Packs/day: 1.00    Years: 20.00    Types: Cigarettes  . Smokeless tobacco: Never Used  . Alcohol use No     Allergies   Patient has no known allergies.   Review of Systems Review of Systems  Constitutional: Negative for fever.  Musculoskeletal: Positive  for arthralgias (L thumb) and joint swelling (L thumb tip). Negative for myalgias.  Skin: Positive for color change (bruise under L thumb nail). Negative for wound.  Allergic/Immunologic: Positive for immunocompromised state (DM2).  Neurological: Negative for weakness and numbness.  Psychiatric/Behavioral: Negative for confusion.   All other systems reviewed and are negative for acute change except as noted in the HPI.    Physical Exam Updated Vital Signs BP 128/87 (BP Location: Right Arm)    Pulse 94   Temp 98.6 F (37 C) (Oral)   Resp 18   SpO2 99%   Physical Exam  Constitutional: He is oriented to person, place, and time. Vital signs are normal. He appears well-developed and well-nourished.  Non-toxic appearance. No distress.  Afebrile, nontoxic, NAD  HENT:  Head: Normocephalic and atraumatic.  Mouth/Throat: Mucous membranes are normal.  Eyes: Conjunctivae and EOM are normal. Right eye exhibits no discharge. Left eye exhibits no discharge.  Neck: Normal range of motion. Neck supple.  Cardiovascular: Normal rate and intact distal pulses.   Pulmonary/Chest: Effort normal. No respiratory distress.  Abdominal: Normal appearance. He exhibits no distension.  Musculoskeletal: Normal range of motion.       Left hand: He exhibits tenderness and swelling. He exhibits normal range of motion, normal capillary refill, no deformity and no laceration. Normal sensation noted. Normal strength noted.  L thumb with FROM intact at all joints, with mild swelling to eponychia area and distal fingertip, with subungual hematoma of ~50% of the nail, skin intact, mild tenseness to the eponychia but no tenseness in the fingerpad itself. No surrounding erythema, warmth, or red streaking. No bruising to remainder of finger. Mild TTP to eponychia and fingertip. No crepitus or deformity, trength and sensation grossly intact, distal pulses intact, compartments soft.   Neurological: He is alert and oriented to person, place, and time. He has normal strength. No sensory deficit.  Skin: Skin is warm, dry and intact. No rash noted.  Psychiatric: He has a normal mood and affect.  Nursing note and vitals reviewed.    ED Treatments / Results  Labs (all labs ordered are listed, but only abnormal results are displayed) Labs Reviewed - No data to display  EKG  EKG Interpretation None       Radiology Dg Hand Complete Left  Result Date: 10/15/2016 CLINICAL DATA:  Left thumb pain after injury. Struck  thumb with a hammer yesterday EXAM: LEFT HAND - COMPLETE 3+ VIEW COMPARISON:  None. FINDINGS: There is no evidence of fracture or dislocation. 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 No findings of inflammatory arthropathy. Soft tissues are unremarkable. IMPRESSION: No fracture or subluxation of the left hand with particular attention to the left thumb. Electronically Signed   By: Rubye Oaks M.D.   On: 10/15/2016 01:40    Procedures Cauterization Date/Time: 10/15/2016 4:13 AM Performed by: Rhona Raider Authorized by: Rhona Raider  Consent: Verbal consent obtained. Risks and benefits: risks, benefits and alternatives were discussed Consent given by: patient Patient understanding: patient states understanding of the procedure being performed Patient consent: the patient's understanding of the procedure matches consent given Patient identity confirmed: verbally with patient Local anesthesia used: no  Anesthesia: Local anesthesia used: no  Sedation: Patient sedated: no Patient tolerance: Patient tolerated the procedure well with no immediate complications Comments: L thumb nail cleansed with alcohol swab, then L thumb subungual hematoma trephination performed using hand-held electrocautery device. Tolerated well, blood evacuated from subungual area.     (including critical  care time)  Medications Ordered in ED Medications - No data to display   Initial Impression / Assessment and Plan / ED Course  I have reviewed the triage vital signs and the nursing notes.  Pertinent labs & imaging results that were available during my care of the patient were reviewed by me and considered in my medical decision making (see chart for details).     51 y.o. male here with subungual hematoma of L thumb after accidentally hitting it with a hammer yesterday. On exam, FROM intact at all joints of the hand, mild TTP to thumb tip, no tenseness of the fingerpad however  some mild tenseness to eponychial region, subungual hematoma visible to ~50% of the nail, skin intact, no warmth/redness/red streaking. NVI with soft compartments. Xray obtained in triage is neg for acute injury. Discussed r/b/a to subungual hematoma trephination using cautery tool, and pt wishes to proceed. Will perform this and reassess  4:25 AM Subungual hematoma evacuated with trephination, and was successful at evacuating considerable amount of blood; pain slightly better after. Advised RICE, tylenol/motrin for pain, and f/up with his PCP in 3-5 days for recheck; advised keeping hands clean and using topical abx and bandage over nail while it's open. I explained the diagnosis and have given explicit precautions to return to the ER including for any other new or worsening symptoms. The patient understands and accepts the medical plan as it's been dictated and I have answered their questions. Discharge instructions concerning home care and prescriptions have been given. The patient is STABLE and is discharged to home in good condition.    Final Clinical Impressions(s) / ED Diagnoses   Final diagnoses:  Subungual hematoma of finger of left hand, initial encounter  Contusion of left thumb without damage to nail, initial encounter    New Prescriptions Discharge Medication List as of 10/15/2016  4:24 AM       Iness Pangilinan, [place], PA-C 10/15/16 0426    Gilda Crease, MD 10/16/16 [phone]

## 2016-10-24 ENCOUNTER — Encounter: Payer: Self-pay | Admitting: Family Medicine

## 2016-10-24 ENCOUNTER — Ambulatory Visit (INDEPENDENT_AMBULATORY_CARE_PROVIDER_SITE_OTHER): Payer: Medicare Other | Admitting: Family Medicine

## 2016-10-24 VITALS — BP 134/78 | HR 98 | Temp 97.8°F | Ht 71.0 in | Wt 192.6 lb

## 2016-10-24 DIAGNOSIS — M79605 Pain in left leg: Secondary | ICD-10-CM | POA: Diagnosis not present

## 2016-10-24 DIAGNOSIS — Z0000 Encounter for general adult medical examination without abnormal findings: Secondary | ICD-10-CM | POA: Diagnosis not present

## 2016-10-24 DIAGNOSIS — Z114 Encounter for screening for human immunodeficiency virus [HIV]: Secondary | ICD-10-CM | POA: Diagnosis not present

## 2016-10-24 DIAGNOSIS — F17210 Nicotine dependence, cigarettes, uncomplicated: Secondary | ICD-10-CM | POA: Diagnosis not present

## 2016-10-24 DIAGNOSIS — E1149 Type 2 diabetes mellitus with other diabetic neurological complication: Secondary | ICD-10-CM

## 2016-10-24 DIAGNOSIS — M79604 Pain in right leg: Secondary | ICD-10-CM | POA: Diagnosis not present

## 2016-10-24 NOTE — Progress Notes (Addendum)
Subjective:    Jerry Shepherd is a 51 y.o. male who presents for Medicare Initial Preventive Examination.  Other concerns today include:  1. Pain in both lower extremities. Bilateral. Already on Neurontin. History of uncontrolled DM and smoking. Pain sometimes in calves, sometimes worse with walking. Pain described as tingling and as aching. No trauma, recently or remotely. No previous workup.   Preventive Screening-Counseling & Management Tobacco History  Smoking Status  . Current Every Day Smoker  . Packs/day: 1.00  . Years: 20.00  . Types: Cigarettes  Smokeless Tobacco  . Never Used    Current Problems (verified) Patient Active Problem List   Diagnosis Date Noted  . Welcome to Medicare preventive visit 10/24/2016  . Polyneuropathy associated with underlying disease (HCC) 10/04/2016  . Nicotine dependence 10/04/2016  . Depression, major, single episode, moderate (HCC) 10/04/2016  . 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 Decreased libido 10/04/2016  . Chronic midline low back pain 10/04/2016   Medications Prior to Visit Current Outpatient Prescriptions on File Prior to Visit  Medication Sig Dispense Refill  . amitriptyline (ELAVIL) 50 MG tablet Take 1 tablet (50 mg total) by mouth at bedtime. 30 tablet 1  . DULoxetine (CYMBALTA) 30 MG capsule TK 3 CS PO ONCE A DAY  0  . gabapentin (NEURONTIN) 800 MG tablet Take 1 tablet (800 mg total) by mouth 3 (three) times daily. 270 tablet 1  . metFORMIN (GLUCOPHAGE) 1000 MG tablet Take 1 tablet (1,000 mg total) by mouth 2 (two) times daily with a meal. 60 tablet 1  . traZODone (DESYREL) 50 MG tablet TK 1 T PO QHS PRN  0   No current facility-administered medications on file prior to visit.    Current Medications (verified) Current Outpatient Prescriptions  Medication Sig Dispense Refill  . amitriptyline (ELAVIL) 50 MG tablet Take 1 tablet (50 mg total) by mouth at bedtime. 30 tablet 1  .  DULoxetine (CYMBALTA) 30 MG capsule TK 3 CS PO ONCE A DAY  0  . gabapentin (NEURONTIN) 800 MG tablet Take 1 tablet (800 mg total) by mouth 3 (three) times daily. 270 tablet 1  . metFORMIN (GLUCOPHAGE) 1000 MG tablet Take 1 tablet (1,000 mg total) by mouth 2 (two) times daily with a meal. 60 tablet 1  . traZODone (DESYREL) 50 MG tablet TK 1 T PO QHS PRN  0   No current facility-administered medications for this visit.     Allergies (verified) Patient has no known allergies.   PAST HISTORY  Past Medical History:  Diagnosis Date  . Chronic midline low back pain 10/04/2016  . Decreased libido 10/04/2016  . Depression   . Depression, major, single episode, moderate (HCC) 10/04/2016  . 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 Polyneuropathy associated with underlying disease (HCC) 10/04/2016  . Smoker 10/04/2016   Past Surgical History:  Procedure Laterality Date  . HERNIA REPAIR     Family History  Problem Relation Age of Onset  . Diabetes Brother   . Cancer Brother   . Heart disease Father    Social History  Substance Use Topics  . Smoking status: Current Every Day Smoker    Packs/day: 1.00    Years: 20.00    Types: Cigarettes  . Smokeless tobacco: Never Used  . Alcohol use No    Are there smokers in your home (other than you)? Yes  Risk Factors Current exercise habits: Gym/ health club routine includes  [employer] DAILY.  Dietary issues discussed: NONE   Cardiac risk factors: diabetes mellitus, male gender, obesity (BMI >= 30 kg/m2) and smoking/ tobacco exposure.  Depression Screen (Note: if answer to either of the following is "Yes", a more complete depression screening is indicated)   Over the past 2 weeks, have you felt down, depressed or hopeless? No  Over the past 2 weeks, have you felt little interest or pleasure in doing things? No  Have you lost interest or pleasure in daily life? No  Do you often feel hopeless? No  Do you  cry easily over simple problems? No  Activities of Daily Living In your present state of health, do you have any difficulty performing the following activities?:  Driving? No Managing money?  No Feeding yourself? No Getting from bed to chair? No Climbing a flight of stairs? No Preparing food and eating?: No Bathing or showering? No Getting dressed: No Getting to the toilet? No Using the toilet:No Moving around from place to place: No In the past year have you fallen or had a near fall?:No   Are you sexually active?  No  Do you have more than one partner?  No  Hearing Difficulties: No Do you often ask people to speak up or repeat themselves? No Do you experience ringing or noises in your ears? No Do you have difficulty understanding soft or whispered voices? No   Do you feel that you have a problem with memory? No  Do you often misplace items? No  Do you feel safe at home?  Yes  Cognitive Testing  Alert? Yes  Normal Appearance?Yes  Oriented to person? Yes  Place? Yes   Time? Yes  Recall of three objects?  Yes  Can perform simple calculations? Yes  Displays appropriate judgment?Yes  Can read the correct time from a watch face?Yes   Advanced Directives have been discussed with the patient? Yes, FULL CODE  List the Names of Other Physician/Practitioners you currently use: 1.  None.  Indicate any recent Medical Services you may have received from other than [facility] providers in the past year (date may be approximate). None.   There is no immunization history on file for this patient.  Screening Tests Health Maintenance  Topic Date Due  . Hepatitis C Screening  11-09-1965  . PNEUMOCOCCAL POLYSACCHARIDE VACCINE (1) 06/14/1965  . OPHTHALMOLOGY EXAM  06/14/1973  . HIV Screening  06/14/1978  . TETANUS/TDAP  06/14/1982  . COLONOSCOPY  06/14/2013  . INFLUENZA VACCINE  01/04/2017  . HEMOGLOBIN A1C  04/06/2017  . URINE MICROALBUMIN  10/04/2017  . FOOT EXAM  10/24/2017     All answers were reviewed with the patient and necessary referrals were made:  Helane Rima, DO   10/24/2016   History reviewed: allergies, current medications, past family history, past medical history, past social history, past surgical history and problem list  Review of Systems Pertinent items are noted in HPI.    Objective:     Vision by Snellen chart:REFERRAL TO EYE  Body mass index is 26.86 kg/m. BP 134/78   Pulse 98   Temp 97.8 F (36.6 C) (Oral)   Ht 5\' 11"  (1.803 m)   Wt 192 lb 9.6 oz (87.4 kg)   SpO2 95%   BMI 26.86 kg/m   General: Cooperative, alert and oriented, well developed, well nourished, in no acute distress. HEENT: Pupils equal round reactive light and extraocular movements intact. Conjunctivae and lids unremarkable, funduscopic exam and visual fields not performed.  No pallor or cyanosis, dentition good. Neck: No thyromegaly. No JVD. No carotid bruits.  Cardiovascular: Regular rhythm. S1 normal. S2 normal. No S3 or S4. Apical impulse not displaced. No murmurs. No gallops. No rubs. Lungs: Clear bilaterally without rales, rhonchi, or wheezing. Normal symmetry, no tenderness to palpation, normal respiratory excursion, no use of accessory muscles. Abdomen: Soft, nontender, no masses or hepatosplenomegaly. Extremities: No clubbing, cyanosis, erythema. No edema. Normal muscle strength and tone. Pulses: 2+ radial, 2+ femoral pulses, 1+ pedal pulses. Skin: Reveals no rashes.  Neurologic: Cranial nerves are intact with no focal deficits.  Psychiatric: Alert and oriented to person place and time.    Assessment:     Diagnoses and all orders for this visit:  Encounter for initial preventive physical examination covered by Medicare -     Ambulatory referral to Gastroenterology -     Hepatitis C antibody  DM (diabetes mellitus), type 2 with neurological complications (HCC) -     Ambulatory referral to Ophthalmology  Screening for human immunodeficiency  virus -     HIV antibody  Cigarette nicotine dependence without complication Comments: The patient was counseled on the dangers of tobacco use, and was advised to quit.  Reviewed strategies to maximize success, including removing cigarettes and smoking materials from environment, stress management, support of family/friends, written materials, local smoking cessation programs (1-800-QUIT-NOW and SMOKEFREE.GOV), and pharmacotherapy.  Pain in both lower extremities Comments: Worsening. Neurontin maxed out. DDx: DM Neuropathy, Radicular Pain, Claudication. See orders. Orders: -     VAS US ABI WITH/WO TBI; Future       Plan:     During the course of the visit the patient was educated and counseled about appropriate screening and preventive services including:    Pneumococcal vaccine   Hepatitis B vaccine  Colorectal cancer screening  Smoking cessation counseling  Advanced directives: has NO advanced directive - not interested in additional information  Diet review for nutrition referral? Yes []    Not Indicated [x]     WILL NEED FUTURE FASTING LIPID PANEL, CONSIDER STATIN.  Patient Instructions (the written plan) was given to the patient.  Medicare Attestation I have personally reviewed: The patient's medical and social history Their use of alcohol, tobacco or illicit drugs Their current medications and supplements The patient's functional ability including ADLs, fall risks, home safety risks, cognitive, and hearing and visual impairment Diet and physical activities Evidence for depression or mood disorders  The patient's weight, height, BMI, and visual acuity have been recorded in the chart.  I have made referrals, counseling, and provided education to the patient based on review of the above and I have provided the patient with a written personalized care plan for preventive services.     Helane RimaErica Priyal Musquiz, DO   10/24/2016

## 2016-10-25 LAB — HEPATITIS C ANTIBODY: HCV Ab: NEGATIVE

## 2016-10-25 LAB — HIV ANTIBODY (ROUTINE TESTING W REFLEX): HIV 1&2 Ab, 4th Generation: NONREACTIVE

## 2016-11-01 ENCOUNTER — Ambulatory Visit: Payer: Medicare Other | Admitting: Family Medicine

## 2016-11-01 DIAGNOSIS — Z0289 Encounter for other administrative examinations: Secondary | ICD-10-CM

## 2016-11-03 ENCOUNTER — Ambulatory Visit (HOSPITAL_COMMUNITY): Admission: RE | Admit: 2016-11-03 | Payer: Medicare Other | Source: Ambulatory Visit

## 2016-11-04 ENCOUNTER — Ambulatory Visit (HOSPITAL_COMMUNITY): Admission: RE | Admit: 2016-11-04 | Payer: Medicare Other | Source: Ambulatory Visit

## 2016-11-05 ENCOUNTER — Encounter (HOSPITAL_COMMUNITY): Payer: Medicare Other

## 2016-11-11 ENCOUNTER — Telehealth: Payer: Self-pay | Admitting: Family Medicine

## 2016-11-11 NOTE — Telephone Encounter (Signed)
Patient's calling to resched missed referral appts for vascular laboratory.   Please advise,  -LL

## 2016-11-11 NOTE — Telephone Encounter (Signed)
Left message for patient to return call.

## 2016-11-11 NOTE — Telephone Encounter (Signed)
Patient came into office seeking to re-make his appointments from referrals. Please advise.

## 2016-11-17 NOTE — Telephone Encounter (Signed)
LM for patient to return call.

## 2016-11-19 ENCOUNTER — Emergency Department (HOSPITAL_COMMUNITY)
Admission: EM | Admit: 2016-11-19 | Discharge: 2016-11-19 | Disposition: A | Discharge status: Home / Self Care | Payer: Medicare Other | Attending: Emergency Medicine | Admitting: Emergency Medicine

## 2016-11-19 ENCOUNTER — Encounter (HOSPITAL_COMMUNITY): Payer: Self-pay

## 2016-11-19 DIAGNOSIS — Z7984 Long term (current) use of oral hypoglycemic drugs: Secondary | ICD-10-CM | POA: Insufficient documentation

## 2016-11-19 DIAGNOSIS — E1140 Type 2 diabetes mellitus with diabetic neuropathy, unspecified: Secondary | ICD-10-CM | POA: Diagnosis not present

## 2016-11-19 DIAGNOSIS — F17210 Nicotine dependence, cigarettes, uncomplicated: Secondary | ICD-10-CM | POA: Insufficient documentation

## 2016-11-19 DIAGNOSIS — S99921A Unspecified injury of right foot, initial encounter: Secondary | ICD-10-CM | POA: Diagnosis present

## 2016-11-19 DIAGNOSIS — Y999 Unspecified external cause status: Secondary | ICD-10-CM | POA: Insufficient documentation

## 2016-11-19 DIAGNOSIS — Y92832 Beach as the place of occurrence of the external cause: Secondary | ICD-10-CM | POA: Insufficient documentation

## 2016-11-19 DIAGNOSIS — T25222A Burn of second degree of left foot, initial encounter: Secondary | ICD-10-CM | POA: Insufficient documentation

## 2016-11-19 DIAGNOSIS — T25221A Burn of second degree of right foot, initial encounter: Secondary | ICD-10-CM | POA: Insufficient documentation

## 2016-11-19 DIAGNOSIS — S90822A Blister (nonthermal), left foot, initial encounter: Secondary | ICD-10-CM | POA: Diagnosis not present

## 2016-11-19 DIAGNOSIS — Y9368 Activity, volleyball (beach) (court): Secondary | ICD-10-CM | POA: Insufficient documentation

## 2016-11-19 DIAGNOSIS — T310 Burns involving less than 10% of body surface: Secondary | ICD-10-CM | POA: Diagnosis not present

## 2016-11-19 DIAGNOSIS — S90821A Blister (nonthermal), right foot, initial encounter: Secondary | ICD-10-CM | POA: Insufficient documentation

## 2016-11-19 DIAGNOSIS — X58XXXA Exposure to other specified factors, initial encounter: Secondary | ICD-10-CM | POA: Diagnosis not present

## 2016-11-19 LAB — CBG MONITORING, ED: Glucose-Capillary: 162 mg/dL — ABNORMAL HIGH (ref 65–99)

## 2016-11-19 MED ORDER — IBUPROFEN 400 MG PO TABS
400.0000 mg | ORAL_TABLET | Freq: Once | ORAL | Status: AC
  Administered 2016-11-19: 400 mg via ORAL
  Filled 2016-11-19: qty 1

## 2016-11-19 MED ORDER — BACITRACIN ZINC 500 UNIT/GM EX OINT
TOPICAL_OINTMENT | Freq: Two times a day (BID) | CUTANEOUS | Status: DC
  Administered 2016-11-19: 1 via TOPICAL
  Filled 2016-11-19: qty 0.9

## 2016-11-19 MED ORDER — ACETAMINOPHEN 500 MG PO TABS
1000.0000 mg | ORAL_TABLET | Freq: Once | ORAL | Status: AC
  Administered 2016-11-19: 1000 mg via ORAL
  Filled 2016-11-19: qty 2

## 2016-11-19 MED ORDER — TRAMADOL HCL 50 MG PO TABS
50.0000 mg | ORAL_TABLET | Freq: Once | ORAL | Status: AC
  Administered 2016-11-19: 50 mg via ORAL
  Filled 2016-11-19: qty 1

## 2016-11-19 NOTE — ED Notes (Signed)
Pt wanting to file complaint.  Patient experience number given to patient.

## 2016-11-19 NOTE — ED Notes (Signed)
Attempting to d/c patient,pt upset that he is still in pain.  Pt wanting to talk with MD.

## 2016-11-19 NOTE — ED Provider Notes (Addendum)
[facility]-EMERGENCY DEPT Provider Note   CSN: [id number] Arrival date & time: 11/19/16  2045     History   Chief Complaint Chief Complaint  Patient presents with  . Foot Injury    HPI Jerry Shepherd is a 51 y.o. male.  Patient c/o blisters to small area on plantar aspect bilateral feet. States was playing beach volleyball/hot sand. States has neuropathy in feet, and didn't realize it was hot.  States hx 'borderline' dm.  Last tetanus within past year. Denies other injury. Does not feel sick or ill. No fever or chills.    The history is provided by the patient.  Foot Injury      Past Medical History:  Diagnosis Date  . Chronic midline low back pain 10/04/2016  . Decreased libido 10/04/2016  . Depression   . Depression, major, single episode, moderate (HCC) 10/04/2016  . 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 Polyneuropathy associated with underlying disease (HCC) 10/04/2016  . Smoker 10/04/2016    Patient Active Problem List   Diagnosis Date Noted  . Welcome to Medicare preventive visit 10/24/2016  . Polyneuropathy associated with underlying disease (HCC) 10/04/2016  . Nicotine dependence 10/04/2016  . Depression, major, single episode, moderate (HCC) 10/04/2016  . 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 Decreased libido 10/04/2016  . Chronic midline low back pain 10/04/2016    Past Surgical History:  Procedure Laterality Date  . HERNIA REPAIR         Home Medications    Prior to Admission medications   Medication Sig Start Date End Date Taking? Authorizing Provider  amitriptyline (ELAVIL) 50 MG tablet Take 1 tablet (50 mg total) by mouth at bedtime. 09/28/16   Elvina Sidle, MD  DULoxetine (CYMBALTA) 30 MG capsule TK 3 CS PO ONCE A DAY 09/13/16   [provider]  gabapentin (NEURONTIN) 800 MG tablet Take 1 tablet (800 mg total) by mouth 3 (three) times daily. 10/04/16   Helane Rima, DO  metFORMIN (GLUCOPHAGE) 1000 MG tablet Take 1 tablet (1,000 mg total) by mouth 2 (two) times daily with a meal. 09/28/16   Elvina Sidle, MD  traZODone (DESYREL) 50 MG tablet TK 1 T PO QHS PRN 09/13/16   [provider]    Family History Family History  Problem Relation Age of Onset  . Diabetes Brother   . Cancer Brother   . Heart disease Father     Social History Social History  Substance Use Topics  . Smoking status: Current Every Day Smoker    Packs/day: 1.00    Years: 20.00    Types: Cigarettes  . Smokeless tobacco: Never Used  . Alcohol use No     Allergies   Patient has no known allergies.   Review of Systems Review of Systems  Constitutional: Negative for chills and fever.  Gastrointestinal: Negative for vomiting.  Skin: Positive for wound. Negative for rash.     Physical Exam Updated Vital Signs BP (!) 151/84   Pulse (!) 130   Temp 98.6 F (37 C)   Resp (!) 22   SpO2 98%   Physical Exam  Constitutional: He appears well-developed and well-nourished. No distress.  HENT:  Mouth/Throat: Oropharynx is clear and moist.  Eyes: Conjunctivae are normal.  Neck: Neck supple. No tracheal deviation present.  Cardiovascular: Intact distal pulses.   Pulmonary/Chest: Effort normal. No accessory muscle usage. No respiratory distress.  Musculoskeletal: He exhibits no edema.  bil dp/pt 2+.   Neurological: He is alert.  Skin: Skin is warm and dry. No rash noted. He is not diaphoretic.  Ruptured blisters to small areas near ball of feet. Right approximately 2 cm diameter, left 3 cm diameter.  No cellulitis or purulent drainage.  Blistered area very sensitive/painful/partial thickness.   Psychiatric: He has a normal mood and affect.  Nursing note and vitals reviewed.    ED Treatments / Results  Labs (all labs ordered are listed, but only abnormal results are displayed) Labs Reviewed  CBG MONITORING, ED - Abnormal; Notable for the following:         Result Value   Glucose-Capillary 162 (*)    All other components within normal limits    EKG  EKG Interpretation None       Radiology No results found.  Procedures Procedures (including critical care time)  Medications Ordered in ED Medications - No data to display   Initial Impression / Assessment and Plan / ED Course  I have reviewed the triage vital signs and the nursing notes.  Pertinent labs & imaging results that were available during my care of the patient were reviewed by me and considered in my medical decision making (see chart for details).  Wound cleaned.   Dead/ruptured blister skin debrided - pt tolerated well.   Bacitracin and sterile dressing.   States has ride, does not have to drive. Ultram po. Motrin po.   Pt requests crutches.   Final Clinical Impressions(s) / ED Diagnoses   Final diagnoses:  None    New Prescriptions New Prescriptions   No medications on file         Cathren LaineSteinl, Asael Pann, MD 11/19/16 2206

## 2016-11-19 NOTE — Discharge Instructions (Signed)
It was our pleasure to provide your ER care today - we hope that you feel better.  Keep blistered areas very clean.  Wash with soap and water 2x/day.  Apply thin coat of bacitracin to areas for the next few days.   Follow up with primary care doctor in the coming week for recheck.  Also have your blood pressure rechecked then as it is high today.   Take acetaminophen and ibuprofen as need for pain.   Return to ER if worse, new symptoms, infection of wound, fevers, other concern.   You were given pain medication in the ER - no driving for the next 4 hours.

## 2016-11-19 NOTE — ED Triage Notes (Signed)
Pt states hx DM. Pt states played volleyball today, multiple blisters to bottom of feet. Pt states has neuropathy, did not have any pain in feet, noticed wounds on feet. Tachy at triage.

## 2016-11-21 ENCOUNTER — Telehealth: Payer: Self-pay | Admitting: Family Medicine

## 2016-11-21 NOTE — Telephone Encounter (Signed)
Patient called to schedule acute. He burned the bottom of his feet in the sand playing volleyball

## 2016-11-22 ENCOUNTER — Ambulatory Visit (INDEPENDENT_AMBULATORY_CARE_PROVIDER_SITE_OTHER): Payer: Medicare Other | Admitting: Family Medicine

## 2016-11-22 ENCOUNTER — Encounter: Payer: Self-pay | Admitting: Family Medicine

## 2016-11-22 VITALS — BP 138/82 | HR 104 | Temp 97.9°F | Ht 71.0 in | Wt 189.2 lb

## 2016-11-22 DIAGNOSIS — E785 Hyperlipidemia, unspecified: Secondary | ICD-10-CM | POA: Diagnosis not present

## 2016-11-22 DIAGNOSIS — T300 Burn of unspecified body region, unspecified degree: Secondary | ICD-10-CM

## 2016-11-22 DIAGNOSIS — G629 Polyneuropathy, unspecified: Secondary | ICD-10-CM | POA: Diagnosis not present

## 2016-11-22 MED ORDER — HYDROCODONE-ACETAMINOPHEN 5-325 MG PO TABS: 1.0000 | ORAL_TABLET | Freq: Four times a day (QID) | ORAL | 0 refills | Status: DC | PRN

## 2016-11-22 MED ORDER — ROSUVASTATIN CALCIUM 10 MG PO TABS: 10.0000 mg | ORAL_TABLET | Freq: Every day | ORAL | 3 refills | Status: DC

## 2016-11-22 MED ORDER — GABAPENTIN 800 MG PO TABS: 800.0000 mg | ORAL_TABLET | Freq: Three times a day (TID) | ORAL | 1 refills | Status: DC

## 2016-11-22 MED ORDER — BACITRACIN 500 UNIT/GM EX OINT: 1.0000 "application " | TOPICAL_OINTMENT | Freq: Two times a day (BID) | CUTANEOUS | 0 refills | Status: DC

## 2016-11-22 NOTE — Telephone Encounter (Signed)
Patient came in for appointment.  

## 2016-11-22 NOTE — Progress Notes (Signed)
Jerry Shepherd is a 51 y.o. male here for an acute visit.  History of Present Illness:   Insurance claims handler, CMA, acting as scribe for Dr. Earlene Plater.  CC:  Patient states he was playing volleyball in the sand on Saturday and burned the bottoms of both feet.  Painful with ambulation.  States he has tried to keep the wounds clean.  He has not tried any medication for this.  Patient with history of severe diabetic neuropathy.  Foot Injury   The incident occurred 3 to 5 days ago (11/19/2016). The [place] the park. The injury mechanism was a burn. The pain is present in the left foot and right foot. The quality of the pain is described as burning. The pain is at a severity of 7/10. The pain is severe. The pain has been constant since onset. He reports no foreign bodies present. The symptoms are aggravated by weight bearing and movement. He has tried nothing for the symptoms.   PMHx, SurgHx, SocialHx, Medications, and Allergies were reviewed in the Visit Navigator and updated as appropriate.  Current Medications:   Current Outpatient Prescriptions:  .  amitriptyline (ELAVIL) 50 MG tablet, Take 1 tablet (50 mg total) by mouth at bedtime., Disp: 30 tablet, Rfl: 1 .  DULoxetine (CYMBALTA) 30 MG capsule, TK 3 CS PO ONCE A DAY, Disp: , Rfl: 0 .  gabapentin (NEURONTIN) 800 MG tablet, Take 1 tablet (800 mg total) by mouth 3 (three) times daily., Disp: 270 tablet, Rfl: 1 .  metFORMIN (GLUCOPHAGE) 1000 MG tablet, Take 1 tablet (1,000 mg total) by mouth 2 (two) times daily with a meal., Disp: 60 tablet, Rfl: 1 .  traZODone (DESYREL) 50 MG tablet, TK 1 T PO QHS PRN, Disp: , Rfl: 0 .  bacitracin 500 UNIT/GM ointment, Apply 1 application topically 2 (two) times daily., Disp: 15 g, Rfl: 0 .  HYDROcodone-acetaminophen (NORCO/VICODIN) 5-325 MG tablet, Take 1 tablet by mouth every 6 (six) hours as needed for moderate pain., Disp: 20 tablet, Rfl: 0 .  rosuvastatin (CRESTOR) 10 MG tablet, Take 1 tablet (10 mg  total) by mouth daily., Disp: 90 tablet, Rfl: 3   No Known Allergies   Review of Systems:   Review of Systems  Constitutional: Negative for chills and fever.  HENT: Negative for congestion, ear pain and sore throat.   Eyes: Negative for blurred vision and pain.  Respiratory: Negative for cough and shortness of breath.   Cardiovascular: Negative for chest pain and palpitations.  Gastrointestinal: Negative for abdominal pain, nausea and vomiting.  Genitourinary: Negative for frequency.  Musculoskeletal: Negative for back pain and joint pain.       Foot pain.  Skin: Negative for rash.  Neurological: Negative for dizziness, loss of consciousness, weakness and headaches.  Endo/Heme/Allergies: Does not bruise/bleed easily.  Psychiatric/Behavioral: Positive for depression. The patient is not nervous/anxious.    Vitals:   Vitals:   11/22/16 0910  BP: 138/82  Pulse: (!) 104  Temp: 97.9 F (36.6 C)  TempSrc: Oral  SpO2: 97%  Weight: 189 lb 3.2 oz (85.8 kg)  Height: 5\' 11"  (1.803 m)     Body mass index is 26.39 kg/m.  Physical Exam:   Physical Exam  Constitutional: He is oriented to person, place, and time. He appears well-developed and well-nourished. No distress.  HENT:  Head: Normocephalic and atraumatic.  Eyes: Conjunctivae and EOM are normal. Pupils are equal, round, and reactive to light.  Neck: Normal range of motion. Neck supple.  Cardiovascular: Normal rate, regular rhythm, normal heart sounds and intact distal pulses.   Pulmonary/Chest: Effort normal and breath sounds normal.  Abdominal: Soft. Bowel sounds are normal.  Musculoskeletal: Normal range of motion.  Neurological: He is alert and oriented to person, place, and time.  Skin: Skin is warm and dry.  Partial thickness burns to plantar surface of both metatarsals. No signs of infection.   Psychiatric: He has a normal mood and affect. His behavior is normal. Judgment and thought content normal.  Nursing note and  vitals reviewed.   Results for orders placed or performed during the hospital encounter of 11/19/16  CBG monitoring, ED  Result Value Ref Range   Glucose-Capillary 162 (H) 65 - 99 mg/dL    Assessment and Plan:   Jerry Shepherd was seen today for acute visit and foot pain.  Diagnoses and all orders for this visit:  Partial thickness burns of multiple sites -     HYDROcodone-acetaminophen (NORCO/VICODIN) 5-325 MG tablet; Take 1 tablet by mouth every 6 (six) hours as needed for moderate pain. -     Ambulatory referral to Podiatry -     bacitracin 500 UNIT/GM ointment; Apply 1 application topically 2 (two) times daily.  Peripheral polyneuropathy -     gabapentin (NEURONTIN) 800 MG tablet; Take 1 tablet (800 mg total) by mouth 3 (three) times daily. -     Ambulatory referral to Podiatry  Hyperlipidemia, unspecified hyperlipidemia type -     rosuvastatin (CRESTOR) 10 MG tablet; Take 1 tablet (10 mg total) by mouth daily.    . Reviewed expectations re: course of current medical issues. . Discussed self-management of symptoms. . Outlined signs and symptoms indicating need for more acute intervention. . Patient verbalized understanding and all questions were answered. Marland Kitchen. Health Maintenance issues including appropriate healthy diet, exercise, and smoking avoidance were discussed with patient. . See orders for this visit as documented in the electronic medical record. . Patient received an After Visit Summary.  CMA served as Neurosurgeonscribe during this visit. History, Physical, and Plan performed by medical provider. The above documentation has been reviewed and is accurate and complete. Helane RimaErica Zetha Kuhar, D.O.  Helane RimaErica Paul Torpey, DO [place], [place] 11/22/2016  Future Appointments Date Time Provider Department Center  01/24/2017 9:45 AM Helane RimaWallace, Tyshia Fenter, DO [facility] None

## 2016-11-25 ENCOUNTER — Encounter: Payer: Self-pay | Admitting: Family Medicine

## 2016-12-26 ENCOUNTER — Ambulatory Visit (INDEPENDENT_AMBULATORY_CARE_PROVIDER_SITE_OTHER): Payer: Medicare Other | Admitting: Podiatry

## 2016-12-26 ENCOUNTER — Encounter: Payer: Self-pay | Admitting: Podiatry

## 2016-12-26 DIAGNOSIS — R0989 Other specified symptoms and signs involving the circulatory and respiratory systems: Secondary | ICD-10-CM

## 2016-12-26 DIAGNOSIS — M79675 Pain in left toe(s): Secondary | ICD-10-CM | POA: Diagnosis not present

## 2016-12-26 DIAGNOSIS — E1149 Type 2 diabetes mellitus with other diabetic neurological complication: Secondary | ICD-10-CM | POA: Diagnosis not present

## 2016-12-26 DIAGNOSIS — B351 Tinea unguium: Secondary | ICD-10-CM

## 2016-12-26 DIAGNOSIS — M79674 Pain in right toe(s): Secondary | ICD-10-CM | POA: Diagnosis not present

## 2016-12-26 MED ORDER — PREGABALIN 75 MG PO CAPS: 75.0000 mg | ORAL_CAPSULE | Freq: Two times a day (BID) | ORAL | 0 refills | Status: DC
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
## 2016-12-26 NOTE — Patient Instructions (Signed)
Pregabalin capsules What is this medicine? PREGABALIN (pre GAB a lin) is used to treat nerve pain from diabetes, shingles, spinal cord injury, and fibromyalgia. It is also used to control seizures in epilepsy. This medicine may be used for other purposes; ask your health care provider or pharmacist if you have questions. COMMON BRAND NAME(S): Lyrica What should I tell my health care provider before I take this medicine? They need to know if you have any of these conditions: -bleeding problems -heart disease, including heart failure -history of alcohol or drug abuse -kidney disease -suicidal thoughts, plans, or attempt; a previous suicide attempt by you or a family member -an unusual or allergic reaction to pregabalin, gabapentin, other medicines, foods, dyes, or preservatives -pregnant or trying to get pregnant or trying to conceive with your partner -breast-feeding How should I use this medicine? Take this medicine by mouth with a glass of water. Follow the directions on the prescription label. You can take this medicine with or without food. Take your doses at regular intervals. Do not take your medicine more often than directed. Do not stop taking except on your doctor's advice. A special MedGuide will be given to you by the pharmacist with each prescription and refill. Be sure to read this information carefully each time. Talk to your pediatrician regarding the use of this medicine in children. Special care may be needed. Overdosage: If you think you have taken too much of this medicine contact a poison control center or emergency room at once. NOTE: This medicine is only for you. Do not share this medicine with others. What if I miss a dose? If you miss a dose, take it as soon as you can. If it is almost time for your next dose, take only that dose. Do not take double or extra doses. What may interact with this medicine? -alcohol -certain medicines for blood pressure like captopril,  enalapril, or lisinopril -certain medicines for diabetes, like pioglitazone or rosiglitazone -certain medicines for anxiety or sleep -narcotic medicines for pain This list may not describe all possible interactions. Give your health care provider a list of all the medicines, herbs, non-prescription drugs, or dietary supplements you use. Also tell them if you smoke, drink alcohol, or use illegal drugs. Some items may interact with your medicine. What should I watch for while using this medicine? Tell your doctor or healthcare professional if your symptoms do not start to get better or if they get worse. Visit your doctor or health care professional for regular checks on your progress. Do not stop taking except on your doctor's advice. You may develop a severe reaction. Your doctor will tell you how much medicine to take. Wear a medical identification bracelet or chain if you are taking this medicine for seizures, and carry a card that describes your disease and details of your medicine and dosage times. You may get drowsy or dizzy. Do not drive, use machinery, or do anything that needs mental alertness until you know how this medicine affects you. Do not stand or sit up quickly, especially if you are an older patient. This reduces the risk of dizzy or fainting spells. Alcohol may interfere with the effect of this medicine. Avoid alcoholic drinks. If you have a heart condition, like congestive heart failure, and notice that you are retaining water and have swelling in your hands or feet, contact your health care provider immediately. The use of this medicine may increase the chance of suicidal thoughts or actions. Pay special attention   to how you are responding while on this medicine. Any worsening of mood, or thoughts of suicide or dying should be reported to your health care professional right away. This medicine has caused reduced sperm counts in some men. This may interfere with the ability to father a  child. You should talk to your doctor or health care professional if you are concerned about your fertility. Women who become pregnant while using this medicine for seizures may enroll in the [place] American Antiepileptic Drug Pregnancy Registry by calling 1-888-233-2334. This registry collects information about the safety of antiepileptic drug use during pregnancy. What side effects may I notice from receiving this medicine? Side effects that you should report to your doctor or health care professional as soon as possible: -allergic reactions like skin rash, itching or hives, swelling of the face, lips, or tongue -breathing problems -changes in vision -chest pain -confusion -jerking or unusual movements of any part of your body -loss of memory -muscle pain, tenderness, or weakness -suicidal thoughts or other mood changes -swelling of the ankles, feet, hands -unusual bruising or bleeding Side effects that usually do not require medical attention (report to your doctor or health care professional if they continue or are bothersome): -dizziness -drowsiness -dry mouth -headache -nausea -tremors -trouble sleeping -weight gain This list may not describe all possible side effects. Call your doctor for medical advice about side effects. You may report side effects to FDA at 1-800-FDA-1088. Where should I keep my medicine? Keep out of the reach of children. This medicine can be abused. Keep your medicine in a safe place to protect it from theft. Do not share this medicine with anyone. Selling or giving away this medicine is dangerous and against the law. This medicine may cause accidental overdose and death if it taken by other adults, children, or pets. Mix any unused medicine with a substance like cat litter or coffee grounds. Then throw the medicine away in a sealed container like a sealed bag or a coffee can with a lid. Do not use the medicine after the expiration date. Store at room  temperature between 15 and 30 degrees C (59 and 86 degrees F). NOTE: This sheet is a summary. It may not cover all possible information. If you have questions about this medicine, talk to your doctor, pharmacist, or health care provider.  2018 Elsevier/Gold Standard (2015-06-25 10:26:12)  

## 2016-12-27 DIAGNOSIS — I739 Peripheral vascular disease, unspecified: Secondary | ICD-10-CM | POA: Insufficient documentation

## 2016-12-30 ENCOUNTER — Telehealth: Payer: Self-pay | Admitting: *Deleted

## 2016-12-30 MED ORDER — PREGABALIN 150 MG PO CAPS: 150.0000 mg | ORAL_CAPSULE | Freq: Two times a day (BID) | ORAL | 5 refills | Status: DC

## 2016-12-30 NOTE — Telephone Encounter (Signed)
Go ahead and stop the gabapentin as well.

## 2016-12-30 NOTE — Telephone Encounter (Addendum)
Pt presented to the office states the Lyrica has worked well for him and his insurance will cover. Dr. Ardelle AntonWagoner stated in LOV 12/26/2016, if pt tolerates and has relief may keep 75mg  bid or increase to 150mg  bid. Pt states he has had no problems and feels he would benefit increasing to 150mg  bid. Dr. Bary CastillaWagoner okayed Lyrica 150mg  #180 one capsule bid +5refills. Left message for pt to stop the Gabapentin, and contact my office confirming received the message.01/13/2017-Pt presented to the office to make sure he was to stop the Gabapentin. I called pt and informed him Dr. Ardelle AntonWagoner wanted him to stop the Gabapentin. Pt asked why and I told him that sometimes when medications that treat the same symptoms are taken together then it could increase a good outcome or decrease a good outcome or cause side effects, but generally that would occur if taken long period of time.

## 2017-01-05 ENCOUNTER — Encounter: Payer: Self-pay | Admitting: Family Medicine

## 2017-01-05 DIAGNOSIS — E119 Type 2 diabetes mellitus without complications: Secondary | ICD-10-CM | POA: Diagnosis not present

## 2017-01-05 DIAGNOSIS — H25042 Posterior subcapsular polar age-related cataract, left eye: Secondary | ICD-10-CM | POA: Diagnosis not present

## 2017-01-05 DIAGNOSIS — H524 Presbyopia: Secondary | ICD-10-CM | POA: Diagnosis not present

## 2017-01-05 LAB — HM DIABETES EYE EXAM

## 2017-01-09 ENCOUNTER — Ambulatory Visit: Payer: Medicare Other | Admitting: Podiatry

## 2017-01-17 ENCOUNTER — Other Ambulatory Visit: Payer: Self-pay | Admitting: Family Medicine

## 2017-01-17 NOTE — Telephone Encounter (Signed)
Patient called to follow up on the above request. TAKE NOTE------ this is a different dosage than originally prescribed. He still has the 800 mg dosage as prescribed 11/22/2016  Next o/v: 01/24/2017  Verified mobile is the best # to reach the patient.

## 2017-01-18 NOTE — Telephone Encounter (Signed)
Please advise on refill. Patient is request a different dose.

## 2017-01-19 NOTE — Telephone Encounter (Signed)
I need clarification - on Neurontin or Lyrica?

## 2017-01-20 NOTE — Telephone Encounter (Signed)
Left message for patient to return call to clarify if he is taking the Lyrica or the Gabapentin. Per Podiatry last note the gabapentin was not helping so he put patient on Lyrica.

## 2017-01-23 ENCOUNTER — Ambulatory Visit (INDEPENDENT_AMBULATORY_CARE_PROVIDER_SITE_OTHER): Payer: Medicare Other | Admitting: Podiatry

## 2017-01-23 ENCOUNTER — Encounter: Payer: Self-pay | Admitting: Podiatry

## 2017-01-23 DIAGNOSIS — E1149 Type 2 diabetes mellitus with other diabetic neurological complication: Secondary | ICD-10-CM | POA: Diagnosis not present

## 2017-01-24 ENCOUNTER — Ambulatory Visit (INDEPENDENT_AMBULATORY_CARE_PROVIDER_SITE_OTHER): Payer: Medicare Other | Admitting: Family Medicine

## 2017-01-24 ENCOUNTER — Encounter: Payer: Self-pay | Admitting: Family Medicine

## 2017-01-24 ENCOUNTER — Encounter: Payer: Self-pay | Admitting: Neurology

## 2017-01-24 VITALS — BP 136/84 | HR 120 | Temp 98.2°F | Ht 71.0 in | Wt 201.0 lb

## 2017-01-24 DIAGNOSIS — F17210 Nicotine dependence, cigarettes, uncomplicated: Secondary | ICD-10-CM

## 2017-01-24 DIAGNOSIS — M25511 Pain in right shoulder: Secondary | ICD-10-CM

## 2017-01-24 DIAGNOSIS — E1149 Type 2 diabetes mellitus with other diabetic neurological complication: Secondary | ICD-10-CM | POA: Diagnosis not present

## 2017-01-24 DIAGNOSIS — E782 Mixed hyperlipidemia: Secondary | ICD-10-CM

## 2017-01-24 DIAGNOSIS — E785 Hyperlipidemia, unspecified: Secondary | ICD-10-CM | POA: Insufficient documentation

## 2017-01-24 LAB — POCT GLYCOSYLATED HEMOGLOBIN (HGB A1C): Hemoglobin A1C: 6.9

## 2017-01-24 MED ORDER — HYDROCODONE-ACETAMINOPHEN 5-325 MG PO TABS: 1.0000 | ORAL_TABLET | Freq: Four times a day (QID) | ORAL | 0 refills | Status: DC | PRN

## 2017-01-24 NOTE — Telephone Encounter (Signed)
Patient in the office for appointment today.

## 2017-01-24 NOTE — Progress Notes (Signed)
Jerry Shepherd is a 51 y.o. male is here for follow up.  History of Present Illness:   Insurance claims handler, CMA, acting as scribe for Dr. Earlene Plater.  HPI:  Patient comes in today for follow up.  States he continues to have severe neuropathy in both feet.  He has been to podiatry and is taking Lyrica 150 mg BID.  No longer taking gabapentin.  States his podiatrist told him they are going to refer him to neurology.  He is supposed to go back and see podiatry in 3 months.  I do not see a neurology referral in the system.  He states they have prescribed him a special cream for his feet that will come from a specialty pharmacy.  The pharmacy is supposed to contact him to confirm his information but he has not heard from them yet.  He also complains of some right shoulder pain. States this has been going on for 2 months.  He wakes in the night with pain at times.  No injury that he is aware of.  Patient continues to smoke.  He is chewing nicotine gum today and also has nicotine patches.  He states he is trying to quit but it is very difficult.  Health Maintenance Due  Topic Date Due  . PNEUMOCOCCAL POLYSACCHARIDE VACCINE (1) 06/14/1965  . TETANUS/TDAP  06/14/1982  . COLONOSCOPY  06/14/2013  . INFLUENZA VACCINE  01/04/2017   Depression screen PHQ 2/9 10/04/2016  Decreased Interest 0  Down, Depressed, Hopeless 1  PHQ - 2 Score 1  Altered sleeping 1  Tired, decreased energy 1  Change in appetite 1  Feeling bad or failure about yourself  1  Trouble concentrating 0  Moving slowly or fidgety/restless 0  Suicidal thoughts 0  PHQ-9 Score 5   PMHx, SurgHx, SocialHx, FamHx, Medications, and Allergies were reviewed in the Visit Navigator and updated as appropriate.   Patient Active Problem List   Diagnosis Date Noted  . HLD (hyperlipidemia) 01/24/2017  . Right shoulder pain 01/24/2017  . PAD (peripheral artery disease) (HCC) 12/27/2016  . Welcome to Medicare preventive visit 10/24/2016  .  Polyneuropathy associated with underlying disease (HCC) 10/04/2016  . Nicotine dependence 10/04/2016  . Depression, major, single episode, moderate (HCC) 10/04/2016  . 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 Decreased libido 10/04/2016  . Chronic midline low back pain 10/04/2016   Social History  Substance Use Topics  . Smoking status: Current Every Day Smoker    Packs/day: 1.00    Years: 20.00    Types: Cigarettes  . Smokeless tobacco: Never Used  . Alcohol use No   Current Medications and Allergies:   Current Outpatient Prescriptions:  .  amitriptyline (ELAVIL) 50 MG tablet, Take 1 tablet (50 mg total) by mouth at bedtime., Disp: 30 tablet, Rfl: 1 .  DULoxetine (CYMBALTA) 30 MG capsule, TK 3 CS PO ONCE A DAY, Disp: , Rfl: 0 .  metFORMIN (GLUCOPHAGE) 1000 MG tablet, Take 1 tablet (1,000 mg total) by mouth 2 (two) times daily with a meal., Disp: 60 tablet, Rfl: 1 .  NON FORMULARY, [facility]  Onychomycosis Nail Lacquer -  Fluconazole 2%, Terbinafine 1% DMSO Apply to affected nail once daily Qty. 120 gm 3 refills, Disp: , Rfl:  .  NON FORMULARY, [facility]  Peripheral Neuropathy Cream- Bupivacaine 1%, Doxepin 3%, Gabapentin 6%, Pentoxifylline 3%, Topiramate 1% Apply 1-2 grams to affected area 3-4 times daily Qty. 120 gm 3 refills, Disp: ,  Rfl:  .  pregabalin (LYRICA) 150 MG capsule, Take 1 capsule (150 mg total) by mouth 2 (two) times daily., Disp: 180 capsule, Rfl: 5 .  rosuvastatin (CRESTOR) 10 MG tablet, Take 1 tablet (10 mg total) by mouth daily., Disp: 90 tablet, Rfl: 3 .  traZODone (DESYREL) 50 MG tablet, TK 1 T PO QHS PRN, Disp: , Rfl: 0  No Known Allergies   Review of Systems   Pertinent items are noted in the HPI. Otherwise, ROS is negative.  Vitals:   Vitals:   01/24/17 0938  BP: 136/84  Pulse: (!) 120  Temp: 98.2 F (36.8 C)  TempSrc: Oral  SpO2: 95%  Weight: 201 lb (91.2 kg)  Height: 5\' 11"  (1.803 m)       Body mass index is 28.03 kg/m. Physical Exam:   Physical Exam  Constitutional: He is oriented to person, place, and time. He appears well-developed and well-nourished. No distress.  HENT:  Head: Normocephalic and atraumatic.  Right Ear: External ear normal.  Left Ear: External ear normal.  Nose: Nose normal.  Mouth/Throat: Oropharynx is clear and moist.  Eyes: Pupils are equal, round, and reactive to light. Conjunctivae and EOM are normal.  Neck: Normal range of motion. Neck supple.  Cardiovascular: Normal rate, regular rhythm, normal heart sounds and intact distal pulses.   Pulmonary/Chest: Effort normal and breath sounds normal.  Abdominal: Soft. Bowel sounds are normal.  Musculoskeletal: Normal range of motion.  Neurological: He is alert and oriented to person, place, and time.  Skin: Skin is warm and dry.  Psychiatric: He has a normal mood and affect. His behavior is normal. Judgment and thought content normal.  Nursing note and vitals reviewed.  Results for orders placed or performed in visit on 01/24/17  POCT glycosylated hemoglobin (Hb A1C)  Result Value Ref Range   Hemoglobin A1C 6.9    Assessment and Plan:   Jerry Shepherd was seen today for follow-up.  Diagnoses and all orders for this visit:  DM (diabetes mellitus), type 2 with neurological complications (HCC) Comments:  Controlled A1c, but with severe PN. Will order nerve conduction study and refer to Neurology. Okay pain control in the meantime.   Lab Results  Component Value Date   HGBA1C 6.9 01/24/2017   Orders: -     POCT glycosylated hemoglobin (Hb A1C) -     HYDROcodone-acetaminophen (NORCO/VICODIN) 5-325 MG tablet; Take 1 tablet by mouth 4 (four) times daily as needed for moderate pain. -     Ambulatory referral to Neurology  Cigarette nicotine dependence without complication Comments: I advised patient to quit smoking, and offered support. [place] QUITLINE: 1-800-QUIT-NOW 951-259-8272).  Mixed  hyperlipidemia Comments:  Lab Results  Component Value Date   CHOL 167 10/04/2016   HDL 39.70 10/04/2016   LDLCALC 89 10/04/2016   TRIG 188.0 (H) 10/04/2016   CHOLHDL 4 10/04/2016    Acute pain of right shoulder -     HYDROcodone-acetaminophen (NORCO/VICODIN) 5-325 MG tablet; Take 1 tablet by mouth 4 (four) times daily as needed for moderate pain. -     Ambulatory referral to Sports Medicine  . Reviewed expectations re: course of current medical issues. . Discussed self-management of symptoms. . Outlined signs and symptoms indicating need for more acute intervention. . Patient verbalized understanding and all questions were answered. Marland Kitchen Health Maintenance issues including appropriate healthy diet, exercise, and smoking avoidance were discussed with patient. . See orders for this visit as documented in the electronic medical record. . Patient received  an After Visit Summary.  CMA served as Neurosurgeon during this visit. History, Physical, and Plan performed by medical provider. The above documentation has been reviewed and is accurate and complete. Helane Rima, D.O.  Helane Rima, DO [place], [place] 01/27/2017  Future Appointments Date Time Provider Department Center  02/01/2017 1:15 PM Andrena Mews, DO [facility] None  04/26/2017 10:45 AM Helane Rima, DO [facility] None  05/01/2017 10:00 AM Nita Sickle K, DO [facility] None  05/01/2017 2:15 PM Ardelle Anton Lesia Sago, DPM [facility]

## 2017-01-25 NOTE — Progress Notes (Signed)
Subjective: 51 year old male presents the office today for follow-up evaluation of neuropathy. He states he has good days and bad days. He states he is getting frustrated as he wants to be a daily activities without any burning, numbness to his feet. He also states the pain continues to wake him up at night. He is coming taking Lyrica 150 mg twice a day. He is previously on gabapentin without any improvement.Denies any systemic complaints such as fevers, chills, nausea, vomiting. No acute changes since last appointment, and no other complaints at this time.   Objective: AAO x3, NAD DP/PT pulses palpable bilaterally, CRT less than 3 seconds Protective sensation decreasedwith Simms Weinstein monofilament No areas of pinpoint bony tenderness or pain with vibratory sensation. MMT 5/5, ROM WNL. No edema, erythema, increase in warmth to bilateral lower extremities.  No open lesions or pre-ulcerative lesions.  No pain with calf compression, swelling, warmth, erythema  Assessment: Symptomatic neuropathy  Plan: -All treatment options discussed with the patient including all alternatives, risks, complications.  -I would add a topical compound cream to help as well. This is ordered today through [employer].  -Also, given ongoing symptoms, I would like for him to see a neurologist. A consult was placed today.  -Daily foot inspection -RTC 3 months -Patient encouraged to call the office with any questions, concerns, change in symptoms.   Ovid Curd, DPM

## 2017-01-26 ENCOUNTER — Telehealth: Payer: Self-pay | Admitting: *Deleted

## 2017-01-26 NOTE — Telephone Encounter (Deleted)
-----   Message from Vivi Barrack, DPM sent at 01/25/2017  4:43 PM EDT ----- Can you please put in for a neurology consult due to neuropathy if not already done so? Thanks.

## 2017-01-26 NOTE — Telephone Encounter (Signed)
That is fine 

## 2017-01-26 NOTE — Telephone Encounter (Addendum)
-----   Message from Vivi Barrack, DPM sent at 01/25/2017  4:43 PM EDT ----- Can you please put in for a neurology consult due to neuropathy if not already done so? Thanks. 01/26/2017-Erica Earlene Plater, DO ordered Neurology referral 01/24/2017.

## 2017-01-29 ENCOUNTER — Emergency Department (HOSPITAL_COMMUNITY)
Admission: EM | Admit: 2017-01-29 | Discharge: 2017-01-30 | Disposition: A | Discharge status: Other Acute Inpatient Hospital | Payer: Medicare Other | Attending: Emergency Medicine | Admitting: Emergency Medicine

## 2017-01-29 DIAGNOSIS — Z7984 Long term (current) use of oral hypoglycemic drugs: Secondary | ICD-10-CM | POA: Insufficient documentation

## 2017-01-29 DIAGNOSIS — F332 Major depressive disorder, recurrent severe without psychotic features: Secondary | ICD-10-CM

## 2017-01-29 DIAGNOSIS — Z79899 Other long term (current) drug therapy: Secondary | ICD-10-CM | POA: Insufficient documentation

## 2017-01-29 DIAGNOSIS — F17210 Nicotine dependence, cigarettes, uncomplicated: Secondary | ICD-10-CM | POA: Insufficient documentation

## 2017-01-29 DIAGNOSIS — R000 Tachycardia, unspecified: Secondary | ICD-10-CM | POA: Diagnosis not present

## 2017-01-29 DIAGNOSIS — F1994 Other psychoactive substance use, unspecified with psychoactive substance-induced mood disorder: Secondary | ICD-10-CM | POA: Diagnosis present

## 2017-01-29 DIAGNOSIS — F1510 Other stimulant abuse, uncomplicated: Secondary | ICD-10-CM | POA: Diagnosis present

## 2017-01-29 DIAGNOSIS — F1910 Other psychoactive substance abuse, uncomplicated: Secondary | ICD-10-CM

## 2017-01-29 DIAGNOSIS — E119 Type 2 diabetes mellitus without complications: Secondary | ICD-10-CM | POA: Diagnosis not present

## 2017-01-29 DIAGNOSIS — R45851 Suicidal ideations: Secondary | ICD-10-CM | POA: Diagnosis not present

## 2017-01-29 LAB — CBC
HEMATOCRIT: 45.9 % (ref 39.0–52.0)
Hemoglobin: 16.8 g/dL (ref 13.0–17.0)
MCH: 32 pg (ref 26.0–34.0)
MCHC: 36.6 g/dL — AB (ref 30.0–36.0)
MCV: 87.4 fL (ref 78.0–100.0)
Platelets: 199 10*3/uL (ref 150–400)
RBC: 5.25 MIL/uL (ref 4.22–5.81)
RDW: 13.9 % (ref 11.5–15.5)
WBC: 8.7 10*3/uL (ref 4.0–10.5)

## 2017-01-29 LAB — COMPREHENSIVE METABOLIC PANEL
ALBUMIN: 4.7 g/dL (ref 3.5–5.0)
ALK PHOS: 78 U/L (ref 38–126)
ALT: 34 U/L (ref 17–63)
AST: 27 U/L (ref 15–41)
Anion gap: 8 (ref 5–15)
BILIRUBIN TOTAL: 0.7 mg/dL (ref 0.3–1.2)
BUN: 18 mg/dL (ref 6–20)
CO2: 25 mmol/L (ref 22–32)
CREATININE: 1.05 mg/dL (ref 0.61–1.24)
Calcium: 10.1 mg/dL (ref 8.9–10.3)
Chloride: 105 mmol/L (ref 101–111)
GFR calc Af Amer: >60 mL/min (ref >60)
GFR calc non Af Amer: >60 mL/min (ref >60)
GLUCOSE: 176 mg/dL — AB (ref 65–99)
Potassium: 3.9 mmol/L (ref 3.5–5.1)
Sodium: 138 mmol/L (ref 135–145)
TOTAL PROTEIN: 8.2 g/dL — AB (ref 6.5–8.1)

## 2017-01-29 LAB — ACETAMINOPHEN LEVEL: Acetaminophen (Tylenol), Serum: <10 ug/mL — ABNORMAL LOW (ref 10–30)

## 2017-01-29 LAB — SALICYLATE LEVEL: Salicylate Lvl: <7 mg/dL (ref 2.8–30.0)

## 2017-01-29 LAB — ETHANOL: Alcohol, Ethyl (B): <5 mg/dL (ref <5)

## 2017-01-29 NOTE — ED Triage Notes (Signed)
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 Pt's last cocaine use was this morning. Pt's last meth use was todayPt has been clean x 8 months and relapsed today.  Pt also has thoughts of SI due to being angry with himself for his relapse.

## 2017-01-29 NOTE — ED Notes (Signed)
Belongings removed from room and pt is in paper scrubs at this time.

## 2017-01-29 NOTE — ED Notes (Signed)
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

## 2017-01-29 NOTE — ED Notes (Signed)
Numbers of friends for patient.   Tomma Lightning [phone]  Alinda Money [phone]  Romeo Apple [phone]

## 2017-01-30 ENCOUNTER — Encounter (HOSPITAL_COMMUNITY): Payer: Self-pay

## 2017-01-30 ENCOUNTER — Inpatient Hospital Stay (HOSPITAL_COMMUNITY)
Admission: AD | Admit: 2017-01-30 | Discharge: 2017-02-03 | DRG: 885 | Disposition: A | Discharge status: Home / Self Care | Payer: Medicare Other | Source: Intra-hospital | Attending: Psychiatry | Admitting: Psychiatry

## 2017-01-30 ENCOUNTER — Encounter: Payer: Self-pay | Admitting: Surgical

## 2017-01-30 DIAGNOSIS — F1914 Other psychoactive substance abuse with psychoactive substance-induced mood disorder: Secondary | ICD-10-CM | POA: Diagnosis present

## 2017-01-30 DIAGNOSIS — F17210 Nicotine dependence, cigarettes, uncomplicated: Secondary | ICD-10-CM | POA: Diagnosis not present

## 2017-01-30 DIAGNOSIS — R45851 Suicidal ideations: Secondary | ICD-10-CM | POA: Diagnosis not present

## 2017-01-30 DIAGNOSIS — F1410 Cocaine abuse, uncomplicated: Secondary | ICD-10-CM | POA: Diagnosis present

## 2017-01-30 DIAGNOSIS — I10 Essential (primary) hypertension: Secondary | ICD-10-CM | POA: Diagnosis present

## 2017-01-30 DIAGNOSIS — F1510 Other stimulant abuse, uncomplicated: Secondary | ICD-10-CM | POA: Diagnosis present

## 2017-01-30 DIAGNOSIS — Z79899 Other long term (current) drug therapy: Secondary | ICD-10-CM

## 2017-01-30 DIAGNOSIS — R000 Tachycardia, unspecified: Secondary | ICD-10-CM | POA: Diagnosis not present

## 2017-01-30 DIAGNOSIS — F1990 Other psychoactive substance use, unspecified, uncomplicated: Secondary | ICD-10-CM | POA: Diagnosis not present

## 2017-01-30 DIAGNOSIS — M25511 Pain in right shoulder: Secondary | ICD-10-CM | POA: Diagnosis not present

## 2017-01-30 DIAGNOSIS — F1490 Cocaine use, unspecified, uncomplicated: Secondary | ICD-10-CM | POA: Diagnosis not present

## 2017-01-30 DIAGNOSIS — E785 Hyperlipidemia, unspecified: Secondary | ICD-10-CM | POA: Diagnosis present

## 2017-01-30 DIAGNOSIS — F1994 Other psychoactive substance use, unspecified with psychoactive substance-induced mood disorder: Secondary | ICD-10-CM | POA: Diagnosis present

## 2017-01-30 DIAGNOSIS — F1290 Cannabis use, unspecified, uncomplicated: Secondary | ICD-10-CM | POA: Diagnosis not present

## 2017-01-30 DIAGNOSIS — Z7984 Long term (current) use of oral hypoglycemic drugs: Secondary | ICD-10-CM

## 2017-01-30 DIAGNOSIS — G8929 Other chronic pain: Secondary | ICD-10-CM | POA: Diagnosis present

## 2017-01-30 DIAGNOSIS — F332 Major depressive disorder, recurrent severe without psychotic features: Secondary | ICD-10-CM | POA: Diagnosis not present

## 2017-01-30 DIAGNOSIS — F1590 Other stimulant use, unspecified, uncomplicated: Secondary | ICD-10-CM | POA: Diagnosis present

## 2017-01-30 DIAGNOSIS — E1149 Type 2 diabetes mellitus with other diabetic neurological complication: Secondary | ICD-10-CM | POA: Diagnosis present

## 2017-01-30 DIAGNOSIS — Z560 Unemployment, unspecified: Secondary | ICD-10-CM | POA: Diagnosis not present

## 2017-01-30 DIAGNOSIS — R197 Diarrhea, unspecified: Secondary | ICD-10-CM | POA: Diagnosis not present

## 2017-01-30 DIAGNOSIS — E119 Type 2 diabetes mellitus without complications: Secondary | ICD-10-CM | POA: Diagnosis not present

## 2017-01-30 LAB — RAPID URINE DRUG SCREEN, HOSP PERFORMED
Amphetamines: POSITIVE — AB
BARBITURATES: NOT DETECTED
Benzodiazepines: NOT DETECTED
COCAINE: POSITIVE — AB
Opiates: NOT DETECTED
Tetrahydrocannabinol: NOT DETECTED

## 2017-01-30 MED ORDER — NICOTINE 21 MG/24HR TD PT24
21.0000 mg | MEDICATED_PATCH | Freq: Every day | TRANSDERMAL | Status: DC
  Administered 2017-02-01 – 2017-02-03 (×3): 21 mg via TRANSDERMAL
  Filled 2017-01-30 (×6): qty 1

## 2017-01-30 MED ORDER — HYDROXYZINE HCL 25 MG PO TABS
25.0000 mg | ORAL_TABLET | Freq: Three times a day (TID) | ORAL | Status: DC | PRN
Start: 2017-01-30 — End: 2017-02-03
  Administered 2017-01-30 – 2017-02-01 (×2): 25 mg via ORAL
  Filled 2017-01-30 (×2): qty 1

## 2017-01-30 MED ORDER — THIAMINE HCL 100 MG/ML IJ SOLN: 100.0000 mg | Freq: Every day | INTRAMUSCULAR | Status: DC

## 2017-01-30 MED ORDER — NICOTINE 21 MG/24HR TD PT24
21.0000 mg | MEDICATED_PATCH | Freq: Every day | TRANSDERMAL | Status: DC
  Administered 2017-01-30: 21 mg via TRANSDERMAL
  Filled 2017-01-30: qty 1

## 2017-01-30 MED ORDER — ACETAMINOPHEN 325 MG PO TABS
650.0000 mg | ORAL_TABLET | Freq: Four times a day (QID) | ORAL | Status: DC | PRN
  Administered 2017-01-30 – 2017-02-01 (×2): 650 mg via ORAL
  Filled 2017-01-30 (×2): qty 2

## 2017-01-30 MED ORDER — AMITRIPTYLINE HCL 25 MG PO TABS: 50.0000 mg | ORAL_TABLET | Freq: Every evening | ORAL | Status: DC | PRN

## 2017-01-30 MED ORDER — LORAZEPAM 1 MG PO TABS
0.0000 mg | ORAL_TABLET | Freq: Four times a day (QID) | ORAL | Status: DC
  Administered 2017-01-30: 1 mg via ORAL
  Administered 2017-01-30: 2 mg via ORAL
  Filled 2017-01-30: qty 2
  Filled 2017-01-30: qty 1

## 2017-01-30 MED ORDER — TRAZODONE HCL 50 MG PO TABS
50.0000 mg | ORAL_TABLET | Freq: Every evening | ORAL | Status: DC | PRN
  Administered 2017-01-30 – 2017-02-02 (×3): 50 mg via ORAL
  Filled 2017-01-30: qty 1

## 2017-01-30 MED ORDER — TRAZODONE HCL 50 MG PO TABS
50.0000 mg | ORAL_TABLET | Freq: Every evening | ORAL | Status: DC | PRN
  Administered 2017-01-30: 50 mg via ORAL
  Filled 2017-01-30: qty 1

## 2017-01-30 MED ORDER — ALUM & MAG HYDROXIDE-SIMETH 200-200-20 MG/5ML PO SUSP: 30.0000 mL | Freq: Four times a day (QID) | ORAL | Status: DC | PRN

## 2017-01-30 MED ORDER — PNEUMOCOCCAL VAC POLYVALENT 25 MCG/0.5ML IJ INJ: 0.5000 mL | INJECTION | INTRAMUSCULAR | Status: DC

## 2017-01-30 MED ORDER — MAGNESIUM HYDROXIDE 400 MG/5ML PO SUSP: 30.0000 mL | Freq: Every day | ORAL | Status: DC | PRN

## 2017-01-30 MED ORDER — LORAZEPAM 1 MG PO TABS: 0.0000 mg | ORAL_TABLET | Freq: Two times a day (BID) | ORAL | Status: DC

## 2017-01-30 MED ORDER — VITAMIN B-1 100 MG PO TABS
100.0000 mg | ORAL_TABLET | Freq: Every day | ORAL | Status: DC
  Administered 2017-01-30: 100 mg via ORAL
  Filled 2017-01-30: qty 1

## 2017-01-30 MED ORDER — LORAZEPAM 1 MG PO TABS
1.0000 mg | ORAL_TABLET | Freq: Once | ORAL | Status: AC
  Administered 2017-01-30: 1 mg via ORAL
  Filled 2017-01-30: qty 1

## 2017-01-30 MED ORDER — LORAZEPAM 2 MG/ML IJ SOLN: 0.0000 mg | Freq: Two times a day (BID) | INTRAMUSCULAR | Status: DC

## 2017-01-30 MED ORDER — AMITRIPTYLINE HCL 25 MG PO TABS
50.0000 mg | ORAL_TABLET | Freq: Every evening | ORAL | Status: DC | PRN
  Administered 2017-01-30: 50 mg via ORAL
  Filled 2017-01-30: qty 2

## 2017-01-30 MED ORDER — METFORMIN HCL 500 MG PO TABS
1000.0000 mg | ORAL_TABLET | Freq: Two times a day (BID) | ORAL | Status: DC
  Administered 2017-01-30 (×2): 1000 mg via ORAL
  Filled 2017-01-30 (×2): qty 2

## 2017-01-30 MED ORDER — LORAZEPAM 2 MG/ML IJ SOLN: 0.0000 mg | Freq: Four times a day (QID) | INTRAMUSCULAR | Status: DC

## 2017-01-30 MED ORDER — METFORMIN HCL 500 MG PO TABS
1000.0000 mg | ORAL_TABLET | Freq: Two times a day (BID) | ORAL | Status: DC
  Filled 2017-01-30 (×3): qty 2

## 2017-01-30 MED ORDER — ROSUVASTATIN CALCIUM 10 MG PO TABS
10.0000 mg | ORAL_TABLET | Freq: Every day | ORAL | Status: DC
  Administered 2017-01-31 – 2017-02-02 (×3): 10 mg via ORAL
  Filled 2017-01-30 (×5): qty 1

## 2017-01-30 MED ORDER — GABAPENTIN 400 MG PO CAPS
1200.0000 mg | ORAL_CAPSULE | Freq: Every day | ORAL | Status: DC
  Administered 2017-01-30 – 2017-02-02 (×4): 1200 mg via ORAL
  Filled 2017-01-30 (×7): qty 3

## 2017-01-30 MED ORDER — ROSUVASTATIN CALCIUM 10 MG PO TABS
10.0000 mg | ORAL_TABLET | Freq: Every day | ORAL | Status: DC
  Administered 2017-01-30: 10 mg via ORAL
  Filled 2017-01-30: qty 1

## 2017-01-30 NOTE — Progress Notes (Signed)
Report called to [facility] Nurse. [facility] is waiting to receive patient 14:30.

## 2017-01-30 NOTE — Progress Notes (Signed)
[facility] is accepting patient at 82. Transportation will be notified accordingly.

## 2017-01-30 NOTE — BH Assessment (Addendum)
Assessment Note  Jerry Shepherd is an 51 y.o. male, who presents voluntary and unaccompanied to [facility]. Pt reported, "I was clean for eight months and I relapsed on methamphetamines and cocaine and felt like I was going to harm myself." Pt reported, emotionally beating himself up, wanting detox, direction and counseling. Pt reported, he was passively suicidal earlier however not currently. Pt denies, HI, AVH, self-injurious and access to weapons.   Pt denies history of abuse. Pt reported, using a gram of methamphetamine, yesterday. Pt reported, using marijuana, a couple of days ago. Pt's UDS is positive for methamphetamine and cocaine. Pt denied being linked to OPT resources (medication management and/or counseling.) Pt reported, a previous inpatient admission, three months ago at [facility] for detox.   Pt presented, quiet/awake in scrubs with logical/cohernet speech. Pt's eye contact was fair. Pt's mood was sad, helpless. Pt's affect was congruent with mood. Pt's thought process was coherent/relevant. Pt's judgement was partial. Pt's concentration was normal. Pt's insight was fair. Pt's impulse control was poor. Pt was oriented x3 (year, city and state.) Pt reported, if discharged from [facility] he could not contract for safety. Pt reported, if inpatient treatment was recommended he would sign-in voluntarily.   Diagnosis:  Major Depressive Disorder, Recurrent, Moderate without Psychotic Features.                     Cocaine Use Disorder, Moderate.                     Amphetamine-type substance use disorder, Moderate.                     Cannabis Use Disorder, Moderate.   Past Medical History:  Past Medical History:  Diagnosis Date  . Chronic midline low back pain 10/04/2016  . Decreased libido 10/04/2016  . Depression   . Depression, major, single episode, moderate (HCC) 10/04/2016  . 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 Polyneuropathy associated with  underlying disease (HCC) 10/04/2016  . Smoker 10/04/2016    Past Surgical History:  Procedure Laterality Date  . HERNIA REPAIR      Family History:  Family History  Problem Relation Age of Onset  . Diabetes Brother   . Cancer Brother   . Heart disease Father     Social History:  reports that he has been smoking Cigarettes.  He has a 20.00 pack-year smoking history. He has never used smokeless tobacco. He reports that he does not drink alcohol or use drugs.  Additional Social History:  Alcohol / Drug Use Pain Medications: See MAR Prescriptions: See MAR Over the Counter: See MAR History of alcohol / drug use?: Yes Substance #1 Name of Substance 1: Marijuana 1 - Age of First Use: UTA 1 - Amount (size/oz): Pt reported, using marijuana a couple days ago. 1 - Frequency: UTA 1 - Duration: UTA 1 - Last Use / Amount: Pt reported, a couple days ago.  Substance #2 Name of Substance 2: Amphetamines 2 - Age of First Use: UTA 2 - Amount (size/oz): Pt reported, using a gram of meth, yesterday.  2 - Frequency: UTA 2 - Duration: UTA 2 - Last Use / Amount: Pt repored, yesterday,   CIWA: CIWA-Ar BP: 114/79 Pulse Rate: 99 COWS:    Allergies: No Known Allergies  Home Medications:  (Not in a hospital admission)  OB/GYN Status:  No LMP for male patient.  General Assessment Data Location of  Assessment: [facility] TTS Assessment: In system Is this a Tele or Face-to-Face Assessment?: Face-to-Face Is this an Initial Assessment or a Re-assessment for this encounter?: Initial Assessment Marital status: Single Living Arrangements: Other (Comment) (Homeless (pt reported, looking for a place.) ) Can pt return to current living arrangement?: No Admission Status: Voluntary Is patient capable of signing voluntary admission?: Yes Referral Source: Self/Family/Friend Insurance type: Medicare.     Crisis Care Plan Living Arrangements: Other (Comment) (Homeless (pt reported, looking for a place.)  ) Legal Guardian: Other: (Self.) Name of Psychiatrist: NA Name of Therapist: NA  Education Status Is patient currently in school?: No Current Grade: NA Highest grade of school patient has completed: GED Name of school: NA Contact person: NA  Risk to self with the past 6 months Suicidal Ideation: No-Not Currently/Within Last 6 Months Has patient been a risk to self within the past 6 months prior to admission? : Yes Suicidal Intent: No Has patient had any suicidal intent within the past 6 months prior to admission? : No (Pt denies.) Is patient at risk for suicide?: Yes Suicidal Plan?: No Has patient had any suicidal plan within the past 6 months prior to admission? : No Access to Means:  (Pt denies.) What has been your use of drugs/alcohol within the last 12 months?: Meth and marijuana. Previous Attempts/Gestures: No How many times?: 0 Other Self Harm Risks: Pt denies. Triggers for Past Attempts: None known Intentional Self Injurious Behavior: None (Pt denies.) Family Suicide History: No Recent stressful life event(s): Other (Comment) (relaspe. ) Persecutory voices/beliefs?: No Depression: Yes Depression Symptoms: Feeling worthless/self pity (low-self-esteem) Substance abuse history and/or treatment for substance abuse?: Yes Suicide prevention information given to non-admitted patients: Not applicable  Risk to Others within the past 6 months Homicidal Ideation: No (Pt denies. ) Does patient have any lifetime risk of violence toward others beyond the six months prior to admission? : No Thoughts of Harm to Others: No Current Homicidal Intent: No Current Homicidal Plan: No Access to Homicidal Means: No Identified Victim: NA History of harm to others?: No Assessment of Violence: None Noted Violent Behavior Description: NA Does patient have access to weapons?: No (Pt denies.) Criminal Charges Pending?: No Does patient have a court date: No Is patient on probation?:  No  Psychosis Hallucinations: None noted Delusions: None noted  Mental Status Report Appearance/Hygiene: In scrubs Eye Contact: Fair Motor Activity: Unremarkable Speech: Logical/coherent Level of Consciousness: Quiet/awake Mood: Sad, Irritable Affect: Other (Comment) (congruent with mood.) Anxiety Level: Moderate Thought Processes: Coherent, Relevant Judgement: Partial Orientation: Other (Comment) (year, city and state.) Obsessive Compulsive Thoughts/Behaviors: None  Cognitive Functioning Concentration: Normal Memory: Recent Intact IQ: Average Insight: Fair Impulse Control: Poor Appetite: Fair Sleep: Decreased Total Hours of Sleep: 6 Vegetative Symptoms: None  ADLScreening [facility] Assessment Services) Patient's cognitive ability adequate to safely complete daily activities?: Yes Patient able to express need for assistance with ADLs?: No Independently performs ADLs?: Yes (appropriate for developmental age)  Prior Inpatient Therapy Prior Inpatient Therapy: Yes Prior Therapy Dates: Pt reported, three months ago.  Prior Therapy Facilty/Provider(s): [facility] Reason for Treatment: Detox.   Prior Outpatient Therapy Prior Outpatient Therapy: No Prior Therapy Dates: NA Prior Therapy Facilty/Provider(s): NA Reason for Treatment: NA Does patient have an ACCT team?: No Does patient have Intensive In-House Services?  : No Does patient have [facility] services? : No Does patient have P4CC services?: No  ADL Screening (condition at time of admission) Patient's cognitive ability adequate to safely complete daily  activities?: Yes Is the patient deaf or have difficulty hearing?: No Does the patient have difficulty seeing, even when wearing glasses/contacts?: Yes Does the patient have difficulty concentrating, remembering, or making decisions?: Yes Patient able to express need for assistance with ADLs?: No Does the patient have difficulty dressing or bathing?: Yes Independently  performs ADLs?: Yes (appropriate for developmental age) Does the patient have difficulty walking or climbing stairs?: No Weakness of Legs: None Weakness of Arms/Hands: None       Abuse/Neglect Assessment (Assessment to be complete while patient is alone) Physical Abuse: Denies (Pt denies. ) Verbal Abuse: Denies (Pt denies. ) Sexual Abuse: Denies (Pt denies. ) Exploitation of patient/patient's resources: Denies (Pt denies. ) Self-Neglect: Denies (Pt denies. )     Advance Directives (For Healthcare) Does Patient Have a Medical Advance Directive?: No    Additional Information 1:1 In Past 12 Months?: No CIRT Risk: No Elopement Risk: No Does patient have medical clearance?: Yes     Disposition: Nira Conn, NP recommends overnight observation, re-evaluation in the morning. Disposition discussed with Dahlia Client, PA and Juliette Alcide, RN.   Disposition Initial Assessment Completed for this Encounter: Yes Disposition of Patient: Other dispositions (AM Psychiatric Evaluation. ) Other disposition(s): Other (Comment) (AM Psychiatric Evaluation. )  On Site Evaluation by:   Reviewed with Physician:  Dahlia Client, PA and Nira Conn, NP.   Redmond Pulling 01/30/2017 3:32 AM   Redmond Pulling, MS, [facility], [facility] Triage Specialist [phone]

## 2017-01-30 NOTE — Progress Notes (Signed)
Jerry Shepherd is a 51 year old male being admitted voluntarily [facility] from [facility].  He came to the ED with passive suicidal ideation and substance abuse.  He reported using meth and cocaine.  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  He has medical diagnosis of diabetes.  He was very fidgety during admission.  He was unable to tell RN how much alcohol he drinks, he stated only twice in the last two days and had been clean for 3 months the stated "just put me down for 1/5 of liquor daily."  He reported passive SI and is able to contract for safety on the unit.  He denies HI or A/V hallucinations.  Oriented him to the unit.  Admission paperwork completed and signed.  Belongings searched and secured in locker # 36.  Skin assessment completed and no skin issues noted.  Q 15 minute checks initiated for safety.  We will monitor the progress towards his goals.

## 2017-01-30 NOTE — Tx Team (Signed)
Initial Treatment Plan 01/30/2017 9:22 PM Jerry Shepherd NIO:[id number]    PATIENT STRESSORS: Financial difficulties Substance abuse   PATIENT STRENGTHS: Wellsite geologist fund of knowledge Physical Health   PATIENT IDENTIFIED PROBLEMS: Depression  Substance abuse  Suicidal ideation  "Try to quit drugs"  "I want a better life"             DISCHARGE CRITERIA:  Improved stabilization in mood, thinking, and/or behavior Verbal commitment to aftercare and medication compliance Withdrawal symptoms are absent or subacute and managed without 24-hour nursing intervention  PRELIMINARY DISCHARGE PLAN: Outpatient therapy Medication management  PATIENT/FAMILY INVOLVEMENT: This treatment plan has been presented to and reviewed with the patient, Jerry Shepherd.  The patient and family have been given the opportunity to ask questions and make suggestions.  Levin Bacon, RN 01/30/2017, 9:22 PM

## 2017-01-30 NOTE — Progress Notes (Signed)
Patient ID: Jerry Shepherd, male   DOB: 1965/06/20, 51 y.o.   MRN: [id number] Per State regulations 482.30 this chart was reviewed for medical necessity with respect to the patient's admission/duration of stay.    Next review date: 02/03/17  Thurman Coyer, BSN, RN-BC  Case Manager

## 2017-01-30 NOTE — Progress Notes (Signed)
Notified counselor of EMTALA form needing to be completed by Provider before patient can to transferred to [facility].

## 2017-01-30 NOTE — BH Assessment (Addendum)
BHH Assessment Progress Note  Per Nanine Means, DNP, this pt would benefit from admission to a psychiatric hospital at this time.  Malva Limes, RN, [facility] has assigned pt to [facility] Rm 304-[facility]; they will be ready to receive pt at 14:30.  Pt has signed Voluntary Admission and Consent for Treatment, as well as Consent to Release Information to no one, and signed forms have been faxed to [facility].  Pt's nurse, Adela Lank, has been notified, and agrees to send original paperwork along with pt via Juel Burrow, and to call report to [phone].  Doylene Canning, MA Triage Specialist [phone]  Addendum:  Per Richelle Ito, the ready time for pt to be transferred has been postponed until 16:30.  Pt's nurse has been notified.  Doylene Canning, MA Triage Specialist [phone]

## 2017-01-30 NOTE — ED Provider Notes (Signed)
[facility]-EMERGENCY DEPT Provider Note   CSN: [id number] Arrival date & time: 01/29/17  2106     History   Chief Complaint Chief Complaint  Patient presents with  . detox  . Suicidal    HPI Jerry Shepherd is a 51 y.o. male with a hx of chronic back pain, NIDDM, peripheral neuropathy, persistent smoker presents to the Emergency Department After relapsing and taking methamphetamine and cocaine for the last 2 days. Patient reports she's been clean for the last 8 months. He reports after several doses of the drugs he began to feel regretful and felt suicidal. He denies plan for suicide. He denies homicidal ideation, auditory or visual hallucinations. Patient denies all somatic symptoms including chest pain, shortness of breath, abdominal pain, nausea, vomiting, diarrhea point this, dizziness, syncope. Patient reports that his drug use has aggravated his suicidal ideation. Nothing seems to make it better. Patient is here voluntarily.  Patient denies other drug use. He denies associated alcohol usage.   The history is provided by the patient and medical records. No language interpreter was used.    Past Medical History:  Diagnosis Date  . Chronic midline low back pain 10/04/2016  . Decreased libido 10/04/2016  . Depression   . Depression, major, single episode, moderate (HCC) 10/04/2016  . 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 Polyneuropathy associated with underlying disease (HCC) 10/04/2016  . Smoker 10/04/2016    Patient Active Problem List   Diagnosis Date Noted  . HLD (hyperlipidemia) 01/24/2017  . Right shoulder pain 01/24/2017  . PAD (peripheral artery disease) (HCC) 12/27/2016  . Welcome to Medicare preventive visit 10/24/2016  . Polyneuropathy associated with underlying disease (HCC) 10/04/2016  . Nicotine dependence 10/04/2016  . Depression, major, single episode, moderate (HCC) 10/04/2016  . DM (diabetes mellitus), type 2 with neurological  complications (HCC) 10/04/2016  . Decreased libido 10/04/2016  . Chronic midline low back pain 10/04/2016    Past Surgical History:  Procedure Laterality Date  . HERNIA REPAIR         Home Medications    Prior to Admission medications   Medication Sig Start Date End Date Taking? Authorizing Provider  amitriptyline (ELAVIL) 50 MG tablet Take 1 tablet (50 mg total) by mouth at bedtime. Patient taking differently: Take 50 mg by mouth at bedtime as needed for sleep.  09/28/16  Yes Elvina Sidle, MD  HYDROcodone-acetaminophen (NORCO/VICODIN) 5-325 MG tablet Take 1 tablet by mouth 4 (four) times daily as needed for moderate pain. 01/24/17  Yes Helane Rima, DO  metFORMIN (GLUCOPHAGE) 1000 MG tablet Take 1 tablet (1,000 mg total) by mouth 2 (two) times daily with a meal. 09/28/16  Yes Elvina Sidle, MD  rosuvastatin (CRESTOR) 10 MG tablet Take 1 tablet (10 mg total) by mouth daily. 11/22/16  Yes Helane Rima, DO  traZODone (DESYREL) 50 MG tablet Take 50 mg by mouth at bedtime as needed for sleep 09/13/16  Yes [provider]  pregabalin (LYRICA) 150 MG capsule Take 1 capsule (150 mg total) by mouth 2 (two) times daily. Patient not taking: Reported on 01/29/2017 12/30/16   Vivi Barrack, DPM    Family History Family History  Problem Relation Age of Onset  . Diabetes Brother   . Cancer Brother   . Heart disease Father     Social History Social History  Substance Use Topics  . Smoking status: Current Every Day Smoker    Packs/day: 1.00    Years: 20.00  Types: Cigarettes  . Smokeless tobacco: Never Used  . Alcohol use No     Allergies   Patient has no known allergies.   Review of Systems Review of Systems  Constitutional: Negative for appetite change, diaphoresis, fatigue, fever and unexpected weight change.  HENT: Negative for mouth sores.   Eyes: Negative for visual disturbance.  Respiratory: Negative for cough, chest tightness, shortness of breath  and wheezing.   Cardiovascular: Negative for chest pain.  Gastrointestinal: Negative for abdominal pain, constipation, diarrhea, nausea and vomiting.  Endocrine: Negative for polydipsia, polyphagia and polyuria.  Genitourinary: Negative for dysuria, frequency, hematuria and urgency.  Musculoskeletal: Negative for back pain and neck stiffness.  Skin: Negative for rash.  Allergic/Immunologic: Negative for immunocompromised state.  Neurological: Negative for syncope, light-headedness and headaches.  Hematological: Does not bruise/bleed easily.  Psychiatric/Behavioral: Positive for suicidal ideas. Negative for sleep disturbance. The patient is nervous/anxious.   All other systems reviewed and are negative.    Physical Exam Updated Vital Signs BP (!) 141/92 (BP Location: Left Arm)   Pulse (!) 121   Temp (!) 97.5 F (36.4 C) (Oral)   Resp (!) 21   Ht 5\' 11"  (1.803 m)   Wt 89.7 kg (197 lb 11.2 oz)   SpO2 96%   BMI 27.57 kg/m   Physical Exam  Constitutional: He appears well-developed and well-nourished. He appears distressed.  Awake, alert, nontoxic appearance  HENT:  Head: Normocephalic and atraumatic.  Mouth/Throat: Oropharynx is clear and moist. No oropharyngeal exudate.  Eyes: Conjunctivae are normal. No scleral icterus.  Neck: Normal range of motion. Neck supple.  Cardiovascular: Regular rhythm and intact distal pulses.  Tachycardia present.   Pulses:      Radial pulses are 2+ on the right side, and 2+ on the left side.  Pulmonary/Chest: Effort normal and breath sounds normal. No respiratory distress. He has no wheezes.  Equal chest expansion  Abdominal: Soft. Bowel sounds are normal. He exhibits no mass. There is no tenderness. There is no rebound and no guarding.  Musculoskeletal: Normal range of motion. He exhibits no edema.  Neurological: He is alert.  Speech is clear and goal oriented Moves extremities without ataxia  Skin: Skin is warm and dry. He is not diaphoretic.    Psychiatric: His behavior is normal. His mood appears anxious. His speech is not rapid and/or pressured. Cognition and memory are normal. He expresses suicidal ideation. He expresses no homicidal ideation. He expresses no suicidal plans and no homicidal plans.  Pt is very anxious, wringing his hands He intermittently makes eye contact  Nursing note and vitals reviewed.    ED Treatments / Results  Labs (all labs ordered are listed, but only abnormal results are displayed) Labs Reviewed  COMPREHENSIVE METABOLIC PANEL - Abnormal; Notable for the following:       Result Value   Glucose, Bld 176 (*)    Total Protein 8.2 (*)    All other components within normal limits  ACETAMINOPHEN LEVEL - Abnormal; Notable for the following:    Acetaminophen (Tylenol), Serum <10 (*)    All other components within normal limits  CBC - Abnormal; Notable for the following:    MCHC 36.6 (*)    All other components within normal limits  RAPID URINE DRUG SCREEN, HOSP PERFORMED - Abnormal; Notable for the following:    Cocaine POSITIVE (*)    Amphetamines POSITIVE (*)    All other components within normal limits  ETHANOL  SALICYLATE LEVEL  EKG  EKG Interpretation  Date/Time:  Monday January 30 2017 02:37:01 EDT Ventricular Rate:  99 PR Interval:  138 QRS Duration: 138 QT Interval:  396 QTC Calculation: 508 R Axis:   112 Text Interpretation:  Normal sinus rhythm Possible Left atrial enlargement Non-specific intra-ventricular conduction block Cannot rule out Anterior infarct , age undetermined T wave abnormality, consider inferolateral ischemia Abnormal ECG No significant change was found Confirmed by Azalia Bilis ([id number]) on 01/30/2017 5:53:13 AM        Procedures Procedures (including critical care time)  Medications Ordered in ED Medications  LORazepam (ATIVAN) tablet 1 mg (not administered)  LORazepam (ATIVAN) injection 0-4 mg (not administered)    Or  LORazepam (ATIVAN) tablet 0-4 mg  (not administered)  LORazepam (ATIVAN) injection 0-4 mg (not administered)    Or  LORazepam (ATIVAN) tablet 0-4 mg (not administered)  thiamine (VITAMIN B-1) tablet 100 mg (not administered)    Or  thiamine (B-1) injection 100 mg (not administered)  alum & mag hydroxide-simeth (MAALOX/MYLANTA) 200-200-20 MG/5ML suspension 30 mL (not administered)  amitriptyline (ELAVIL) tablet 50 mg (not administered)  metFORMIN (GLUCOPHAGE) tablet 1,000 mg (not administered)  rosuvastatin (CRESTOR) tablet 10 mg (not administered)  traZODone (DESYREL) tablet 50 mg (not administered)  nicotine (NICODERM CQ - dosed in mg/24 hours) patch 21 mg (not administered)     Initial Impression / Assessment and Plan / ED Course  I have reviewed the triage vital signs and the nursing notes.  Pertinent labs & imaging results that were available during my care of the patient were reviewed by me and considered in my medical decision making (see chart for details).  Clinical Course as of Jan 31 519  Mon Jan 30, 2017  1191 Discussed with TTS who recommends AM psyc eval  [HM]  0318 Improved HR Pulse Rate: 99 [HM]  0318 Improved BP BP: 114/79 [HM]    Clinical Course User Index [HM] Gilda Abboud, Dahlia Client, PA-C    Patient presents emergency department feeling suicidal after using cocaine and methamphetamine.  He has vague suicidal ideation without plan. He's tachycardic on arrival likely secondary to his cocaine and amphetamine usage. Mucous membranes are moist. He is not appear dehydrated. Patient will be given Ativan for his anxiety. He is otherwise medically cleared for TTS evaluation. Will monitor his tachycardia. He adamantly denies chest pain or shortness of breath.  5:53 AM TTS recommends AM Psyc consult.  Final Clinical Impressions(s) / ED Diagnoses   Final diagnoses:  Suicidal ideation  Polysubstance abuse  Tachycardia    New Prescriptions New Prescriptions   No medications on file       Milta Deiters 01/30/17 4782    Azalia Bilis, MD 02/02/17 [phone]

## 2017-01-31 DIAGNOSIS — R197 Diarrhea, unspecified: Secondary | ICD-10-CM

## 2017-01-31 DIAGNOSIS — F17210 Nicotine dependence, cigarettes, uncomplicated: Secondary | ICD-10-CM

## 2017-01-31 DIAGNOSIS — E119 Type 2 diabetes mellitus without complications: Secondary | ICD-10-CM

## 2017-01-31 DIAGNOSIS — F1510 Other stimulant abuse, uncomplicated: Secondary | ICD-10-CM

## 2017-01-31 DIAGNOSIS — F332 Major depressive disorder, recurrent severe without psychotic features: Principal | ICD-10-CM

## 2017-01-31 DIAGNOSIS — Z560 Unemployment, unspecified: Secondary | ICD-10-CM

## 2017-01-31 DIAGNOSIS — F1290 Cannabis use, unspecified, uncomplicated: Secondary | ICD-10-CM

## 2017-01-31 DIAGNOSIS — F1490 Cocaine use, unspecified, uncomplicated: Secondary | ICD-10-CM

## 2017-01-31 DIAGNOSIS — E785 Hyperlipidemia, unspecified: Secondary | ICD-10-CM

## 2017-01-31 DIAGNOSIS — M25511 Pain in right shoulder: Secondary | ICD-10-CM

## 2017-01-31 DIAGNOSIS — F1990 Other psychoactive substance use, unspecified, uncomplicated: Secondary | ICD-10-CM

## 2017-01-31 MED ORDER — LOPERAMIDE HCL 2 MG PO CAPS
4.0000 mg | ORAL_CAPSULE | ORAL | Status: DC | PRN
  Administered 2017-01-31: 4 mg via ORAL

## 2017-01-31 MED ORDER — MIRTAZAPINE 15 MG PO TABS
15.0000 mg | ORAL_TABLET | Freq: Every day | ORAL | Status: DC
  Administered 2017-01-31 – 2017-02-02 (×3): 15 mg via ORAL
  Filled 2017-01-31 (×5): qty 1

## 2017-01-31 MED ORDER — ONDANSETRON 4 MG PO TBDP: 4.0000 mg | ORAL_TABLET | Freq: Three times a day (TID) | ORAL | Status: DC | PRN

## 2017-01-31 MED ORDER — METFORMIN HCL 500 MG PO TABS
500.0000 mg | ORAL_TABLET | Freq: Two times a day (BID) | ORAL | Status: DC
  Administered 2017-01-31 – 2017-02-02 (×4): 500 mg via ORAL
  Filled 2017-01-31 (×8): qty 1

## 2017-01-31 NOTE — Progress Notes (Signed)
Pt attended AA meeting this evening.  

## 2017-01-31 NOTE — Progress Notes (Signed)
Nursing Progress Note: 7p-7a D: Pt currently presents with a depressed anxious affect and behavior. Pt states "I've been in bed all day because that metformin has really screwed up my stomach. I am having diarrhea every four hours or so, so I am afraid to participate because it may lead to an accident." Not interacting with the milieu. Pt reports fair sleep during the previous night with current medication regimen. Pt did not attend wrap-up group.  A: Pt provided with medications per providers orders. Pt's labs and vitals were monitored throughout the night. Pt supported emotionally and encouraged to express concerns and questions. Pt educated on medications.  R: Pt's safety ensured with 15 minute and environmental checks. Pt currently denies SI, HI, and AVH. Pt verbally contracts to seek staff if SI,HI, or AVH occurs and to consult with staff before acting on any harmful thoughts. Will continue to monitor.

## 2017-01-31 NOTE — Progress Notes (Deleted)
CSW attempted to complete PSA with pt. Pt was in bed in fetal position and stated that he doesn't feel well and his stomach has been "acting up". Pt states that he has been back and forth to the bathroom all day. Pt's room smells of feces and there was feces smeared on the bench in pt's room. Pt requested that CSW return at a later time. CSW agreed. CSW informed staff of feces smeared on bench in pt's room. Pt's roommate is also bothered by this and reported to this writer in reference to the pt, "He has been defecating everywhere in the room".  Amay Mijangos B Prather Failla, MSW, LCSWA [phone]  

## 2017-01-31 NOTE — BHH Group Notes (Signed)
LCSW Group Therapy Note  01/31/2017 1:15pm  Type of Therapy/Topic:  Group Therapy:  Feelings about Diagnosis  Participation Level: Pt invited. Did not attend.   Jonathon Jordan, MSW, LCSWA 01/31/2017 3:03 PM

## 2017-01-31 NOTE — H&P (Signed)
Psychiatric Admission Assessment Adult  Patient Identification: Jerry Shepherd MRN:  [id number] Date of Evaluation:  01/31/2017 Chief Complaint:  MDD RECURRENT MODERATE WITHOUT PSYCHOTIC FEATURES COCAINE USE DISORDER MODERATE AMPHETAMINE TYPE SUBSTANCE USE DISORDER,MOD, Principal Diagnosis: Major depressive disorder, recurrent severe without psychotic features ([place]) Diagnosis:   Patient Active Problem List   Diagnosis Date Noted  . 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 Major depressive disorder, recurrent severe without psychotic features ([place]) [F33.2] 01/30/2017  . HLD (hyperlipidemia) [E78.5] 01/24/2017  . Right shoulder pain [M25.511] 01/24/2017  . PAD (peripheral artery disease) ([place]) [I73.9] 12/27/2016  . Welcome to Medicare preventive visit [Z00.00] 10/24/2016  . Polyneuropathy associated with underlying disease ([place]) [G63] 10/04/2016  . Nicotine dependence [F17.200] 10/04/2016  . DM (diabetes mellitus), type 2 with neurological complications ([place]) [R91.63] 10/04/2016  . Decreased libido [R68.82] 10/04/2016  . Chronic midline low back pain [M54.5, G89.29] 10/04/2016   History of Present Illness: 51 y.o. White male presents in his bed today with complaint of uncontrollable diarrhea. He reports compliance with his medications recently, but due to his reported history of non-compliance it is possible the restart of his Metformin and the large dose of Gabapentin he was given may have caused his diarrhea. He denies any SI/HI/AVH at this time and contracts for safety. He states that he has been clean for 8 months and was staying at the [facility]. He relapsed and used cocaine and meth and was then kicked out of [facility]. He states that the reason he became depressed was due to feeling like he failed by relapsing and then losing his housing. He continues stating a one time relapse so no detox is needed. He reports to me at this time that his plan is to be discharged and then he will  probably go to [facility] because they will let him stay there for about 2 weeks. Suspect possibly seeking secondary gain from hospital or another facility for housing. Patient also reports that he is current with NA, but did not contact his sponsor for support. He is on several medications and most are sedating or used for some pain. I requested Dr. Sanjuana Letters to review medications and make changes.   01/30/17: Admission nurse note: 51 year old male being admitted voluntarily to [facility] from [facility].  He came to the ED with passive suicidal ideation and substance abuse.  He reported using meth and cocaine.  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  He has medical diagnosis of diabetes.  He was very fidgety during admission.  He was unable to tell RN how much alcohol he drinks, he stated only twice in the last two days and had been clean for 3 months the stated "just put me down for 1/5 of liquor daily."  He reported passive SI and is able to contract for safety on the unit.  He denies HI or A/V hallucinations.   Associated Signs/Symptoms: Depression Symptoms:  depressed mood, hopelessness, suicidal thoughts without plan, (Hypo) Manic Symptoms:  Denies Anxiety Symptoms:  Social Anxiety, Psychotic Symptoms:  Denies PTSD Symptoms: Negative Total Time spent with patient: 30 minutes  Past Psychiatric History: MDD, polysubstance abuse  Is the patient at risk to self? No.  Has the patient been a risk to self in the past 6 months? No.  Has the patient been a risk to self within the distant past? No.  Is the patient a risk to others? No.  Has  the patient been a risk to others in the past 6 months? No.  Has the patient been a risk to others within the distant past? No.   Prior Inpatient Therapy:   Prior Outpatient Therapy:    Alcohol Screening: 1. How often do you have a drink containing alcohol?: 2 to 3  times a week 2. How many drinks containing alcohol do you have on a typical day when you are drinking?: 10 or more 3. How often do you have six or more drinks on one occasion?: Weekly Preliminary Score: 7 4. How often during the last year have you found that you were not able to stop drinking once you had started?: Never 5. How often during the last year have you failed to do what was normally expected from you becasue of drinking?: Less than monthly 6. How often during the last year have you needed a first drink in the morning to get yourself going after a heavy drinking session?: Never 7. How often during the last year have you had a feeling of guilt of remorse after drinking?: Monthly 8. How often during the last year have you been unable to remember what happened the night before because you had been drinking?: Less than monthly 9. Have you or someone else been injured as a result of your drinking?: No 10. Has a relative or friend or a doctor or another health worker been concerned about your drinking or suggested you cut down?: No Alcohol Use Disorder Identification Test Final Score (AUDIT): 14 Brief Intervention: Yes Substance Abuse History in the last 12 months:  Yes.   Consequences of Substance Abuse: Medical Consequences:  reviewed Legal Consequences:  reviewed Family Consequences:  reviewed Previous Psychotropic Medications: Yes  Psychological Evaluations: Yes  Past Medical History:  Past Medical History:  Diagnosis Date  . Chronic midline low back pain 10/04/2016  . Decreased libido 10/04/2016  . Depression   . Depression, major, single episode, moderate ([place]) 10/04/2016  . DM (diabetes mellitus), type 2 with neurological complications ([place]) 08/09/4654  . Polyneuropathy associated with underlying disease ([place]) 10/04/2016  . Smoker 10/04/2016    Past Surgical History:  Procedure Laterality Date  . HERNIA REPAIR     Family History:  Family History  Problem Relation Age of Onset  .  Diabetes Brother   . Cancer Brother   . Heart disease Father    Family Psychiatric  History: Denies Tobacco Screening: Have you used any form of tobacco in the last 30 days? (Cigarettes, Smokeless Tobacco, Cigars, and/or Pipes): Yes Tobacco use, Select all that apply: 5 or more cigarettes per day Are you interested in Tobacco Cessation Medications?: Yes, will notify MD for an order Counseled patient on smoking cessation including recognizing danger situations, developing coping skills and basic information about quitting provided: Refused/Declined practical counseling Social History:  History  Alcohol Use No     History  Drug Use No    Additional Social History:   Allergies:  No Known Allergies Lab Results:  Results for orders placed or performed during the hospital encounter of 01/29/17 (from the past 48 hour(s))  Comprehensive metabolic panel     Status: Abnormal   Collection Time: 01/29/17 10:36 PM  Result Value Ref Range   Sodium 138 135 - 145 mmol/L   Potassium 3.9 3.5 - 5.1 mmol/L   Chloride 105 101 - 111 mmol/L   CO2 25 22 - 32 mmol/L   Glucose, Bld 176 (H) 65 - 99 mg/dL  BUN 18 6 - 20 mg/dL   Creatinine, Ser 1.05 0.61 - 1.24 mg/dL   Calcium 10.1 8.9 - 10.3 mg/dL   Total Protein 8.2 (H) 6.5 - 8.1 g/dL   Albumin 4.7 3.5 - 5.0 g/dL   AST 27 15 - 41 U/L   ALT 34 17 - 63 U/L   Alkaline Phosphatase 78 38 - 126 U/L   Total Bilirubin 0.7 0.3 - 1.2 mg/dL   GFR calc non Af Amer >60 >60 mL/min   GFR calc Af Amer >60 >60 mL/min    Comment: (NOTE) The eGFR has been calculated using the CKD EPI equation. This calculation has not been validated in all clinical situations. eGFR's persistently <60 mL/min signify possible Chronic Kidney Disease.    Anion gap 8 5 - 15  Ethanol     Status: None   Collection Time: 01/29/17 10:36 PM  Result Value Ref Range   Alcohol, Ethyl (B) <5 <5 mg/dL    Comment:        LOWEST DETECTABLE LIMIT FOR SERUM ALCOHOL IS 5 mg/dL FOR MEDICAL  PURPOSES ONLY   Salicylate level     Status: None   Collection Time: 01/29/17 10:36 PM  Result Value Ref Range   Salicylate Lvl <4.6 2.8 - 30.0 mg/dL  Acetaminophen level     Status: Abnormal   Collection Time: 01/29/17 10:36 PM  Result Value Ref Range   Acetaminophen (Tylenol), Serum <10 (L) 10 - 30 ug/mL    Comment:        THERAPEUTIC CONCENTRATIONS VARY SIGNIFICANTLY. A RANGE OF 10-30 ug/mL MAY BE AN EFFECTIVE CONCENTRATION FOR MANY PATIENTS. HOWEVER, SOME ARE BEST TREATED AT CONCENTRATIONS OUTSIDE THIS RANGE. ACETAMINOPHEN CONCENTRATIONS >150 ug/mL AT 4 HOURS AFTER INGESTION AND >50 ug/mL AT 12 HOURS AFTER INGESTION ARE OFTEN ASSOCIATED WITH TOXIC REACTIONS.   cbc     Status: Abnormal   Collection Time: 01/29/17 10:36 PM  Result Value Ref Range   WBC 8.7 4.0 - 10.5 K/uL   RBC 5.25 4.22 - 5.81 MIL/uL   Hemoglobin 16.8 13.0 - 17.0 g/dL   HCT 45.9 39.0 - 52.0 %   MCV 87.4 78.0 - 100.0 fL   MCH 32.0 26.0 - 34.0 pg   MCHC 36.6 (H) 30.0 - 36.0 g/dL   RDW 13.9 11.5 - 15.5 %   Platelets 199 150 - 400 K/uL  Rapid urine drug screen (hospital performed)     Status: Abnormal   Collection Time: 01/30/17 12:46 AM  Result Value Ref Range   Opiates NONE DETECTED NONE DETECTED   Cocaine POSITIVE (A) NONE DETECTED   Benzodiazepines NONE DETECTED NONE DETECTED   Amphetamines POSITIVE (A) NONE DETECTED   Tetrahydrocannabinol NONE DETECTED NONE DETECTED   Barbiturates NONE DETECTED NONE DETECTED    Comment:        DRUG SCREEN FOR MEDICAL PURPOSES ONLY.  IF CONFIRMATION IS NEEDED FOR ANY PURPOSE, NOTIFY LAB WITHIN 5 DAYS.        LOWEST DETECTABLE LIMITS FOR URINE DRUG SCREEN Drug Class       Cutoff (ng/mL) Amphetamine      1000 Barbiturate      200 Benzodiazepine   568 Tricyclics       127 Opiates          300 Cocaine          300 THC              50     Blood Alcohol level:  Lab Results  Component Value Date   ETH <5 47/65/4650    Metabolic Disorder Labs:  Lab  Results  Component Value Date   HGBA1C 6.9 01/24/2017   MPG 280 04/19/2016   No results found for: PROLACTIN Lab Results  Component Value Date   CHOL 167 10/04/2016   TRIG 188.0 (H) 10/04/2016   HDL 39.70 10/04/2016   CHOLHDL 4 10/04/2016   VLDL 37.6 10/04/2016   LDLCALC 89 10/04/2016    Current Medications: Current Facility-Administered Medications  Medication Dose Route Frequency Provider Last Rate Last Dose  . acetaminophen (TYLENOL) tablet 650 mg  650 mg Oral Q6H PRN Patrecia Pour, NP   650 mg at 01/30/17 2132  . amitriptyline (ELAVIL) tablet 50 mg  50 mg Oral QHS PRN Patrecia Pour, NP   50 mg at 01/30/17 2133  . gabapentin (NEURONTIN) capsule 1,200 mg  1,200 mg Oral QHS Patriciaann Clan E, PA-C   1,200 mg at 01/30/17 2214  . hydrOXYzine (ATARAX/VISTARIL) tablet 25 mg  25 mg Oral TID PRN Hughie Closs A, NP   25 mg at 01/30/17 2133  . magnesium hydroxide (MILK OF MAGNESIA) suspension 30 mL  30 mL Oral Daily PRN Patrecia Pour, NP      . metFORMIN (GLUCOPHAGE) tablet 500 mg  500 mg Oral BID WC Westyn Driggers B, FNP      . nicotine (NICODERM CQ - dosed in mg/24 hours) patch 21 mg  21 mg Transdermal Daily Lord, Jamison Y, NP      . ondansetron (ZOFRAN-ODT) disintegrating tablet 4 mg  4 mg Oral Q8H PRN Valincia Touch, Lowry Ram, FNP      . pneumococcal 23 valent vaccine (PNU-IMMUNE) injection 0.5 mL  0.5 mL Intramuscular Tomorrow-1000 Cobos, Fernando A, MD      . rosuvastatin (CRESTOR) tablet 10 mg  10 mg Oral q1800 Patrecia Pour, NP      . traZODone (DESYREL) tablet 50 mg  50 mg Oral QHS PRN Patrecia Pour, NP   50 mg at 01/30/17 2133   PTA Medications: Prescriptions Prior to Admission  Medication Sig Dispense Refill Last Dose  . amitriptyline (ELAVIL) 50 MG tablet Take 1 tablet (50 mg total) by mouth at bedtime. (Patient taking differently: Take 50 mg by mouth at bedtime as needed for sleep. ) 30 tablet 1 Past Week at Unknown time  . HYDROcodone-acetaminophen (NORCO/VICODIN) 5-325  MG tablet Take 1 tablet by mouth 4 (four) times daily as needed for moderate pain. 60 tablet 0 01/28/2017 at Unknown time  . metFORMIN (GLUCOPHAGE) 1000 MG tablet Take 1 tablet (1,000 mg total) by mouth 2 (two) times daily with a meal. 60 tablet 1 01/28/2017 at Unknown time  . pregabalin (LYRICA) 150 MG capsule Take 1 capsule (150 mg total) by mouth 2 (two) times daily. (Patient not taking: Reported on 01/29/2017) 180 capsule 5 Not Taking at Unknown time  . rosuvastatin (CRESTOR) 10 MG tablet Take 1 tablet (10 mg total) by mouth daily. 90 tablet 3 01/28/2017 at Unknown time  . traZODone (DESYREL) 50 MG tablet Take 50 mg by mouth at bedtime as needed for sleep  0 unk at Unknown time    Musculoskeletal: Strength & Muscle Tone: within normal limits Gait & Station: normal Patient leans: N/A  Psychiatric Specialty Exam: Physical Exam  Nursing note and vitals reviewed. Constitutional: He is oriented to person, place, and time. He appears well-developed and well-nourished.  Respiratory: Effort normal.  Musculoskeletal: Normal range of motion.  Neurological: He is alert and oriented to person, place, and time.  Skin: Skin is warm.    Review of Systems  Constitutional: Negative.   HENT: Negative.   Eyes: Negative.   Respiratory: Negative.   Cardiovascular: Negative.   Gastrointestinal: Positive for diarrhea.  Genitourinary: Negative.   Musculoskeletal: Negative.   Skin: Negative.   Neurological: Negative.   Endo/Heme/Allergies: Negative.     Blood pressure 109/76, pulse (!) 104, temperature 97.8 F (36.6 C), temperature source Oral, resp. rate 18, height '5\' 11"'  (1.803 m), weight 87.1 kg (192 lb), SpO2 100 %.Body mass index is 26.78 kg/m.  General Appearance: Disheveled  Eye Contact:  Absent  Speech:  Clear and Coherent  Volume:  Normal  Mood:  Depressed  Affect:  Flat  Thought Process:  Coherent, Goal Directed and Descriptions of Associations: Intact  Orientation:  Full (Time, Place,  and Person)  Thought Content:  WDL  Suicidal Thoughts:  No  Homicidal Thoughts:  No  Memory:  Immediate;   Good Recent;   Good  Judgement:  Fair  Insight:  Fair  Psychomotor Activity:  Normal  Concentration:  Concentration: Fair  Recall:  Good  Fund of Knowledge:  Fair  Language:  Good  Akathisia:  No  Handed:  Right  AIMS (if indicated):     Assets:  Financial Resources/Insurance  ADL's:  Intact  Cognition:  WNL  Sleep:       Treatment Plan Summary: Daily contact with patient to assess and evaluate symptoms and progress in treatment, Medication management and Plan See [facility] and physician SRA for medictaion changes  Due to patient's diarrhea decreased Metformin to 500 mg PO BD and added Zofran ODT 4 mg Q8H PRN for nausea/vomiting.   Observation Level/Precautions:  15 minute checks  Laboratory:  Reviewed  Psychotherapy:  Group therapy  Medications:  See MAR  Consultations:  As needed  Discharge Concerns:  Relapse  Estimated LOS: 3-5 Days  Other:  Admit to [facility] for Primary Diagnosis: Major depressive disorder, recurrent severe without psychotic features ([place]) Long Term Goal(s): Improvement in symptoms so as ready for discharge  Short Term Goals: Ability to verbalize feelings will improve and Ability to disclose and discuss suicidal ideas  Physician Treatment Plan for Secondary Diagnosis: Principal Problem:   Major depressive disorder, recurrent severe without psychotic features ([place])  Long Term Goal(s): Improvement in symptoms so as ready for discharge  Short Term Goals: Ability to maintain clinical measurements within normal limits will improve and Compliance with prescribed medications will improve  I certify that inpatient services furnished can reasonably be expected to improve the patient's condition.    Lewis Shock, FNP 8/28/20182:37 PM

## 2017-01-31 NOTE — B[facility] Suicide Risk Assessment (Signed)
[facility] Admission Suicide Risk Assessment   Nursing information obtained from:  Patient Demographic factors:  Male, Caucasian Current Mental Status:  Suicidal ideation indicated by patient Loss Factors:  Financial problems / change in socioeconomic status Historical Factors:  NA Risk Reduction Factors:  NA  Total Time spent with patient: 20 minutes Principal Problem: Major depressive disorder, recurrent severe without psychotic features ([facility]) Diagnosis:  Substance Induced Mood Disorder Patient Active Problem List   Diagnosis Date Noted  . 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 Major depressive disorder, recurrent severe without psychotic features ([facility]) [F33.2] 01/30/2017  . HLD (hyperlipidemia) [E78.5] 01/24/2017  . Right shoulder pain [M25.511] 01/24/2017  . PAD (peripheral artery disease) ([facility]) [I73.9] 12/27/2016  . Welcome to Medicare preventive visit [Z00.00] 10/24/2016  . Polyneuropathy associated with underlying disease ([facility]) [G63] 10/04/2016  . Nicotine dependence [F17.200] 10/04/2016  . 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 Decreased libido [R68.82] 10/04/2016  . Chronic midline low back pain [M54.5, G89.29] 10/04/2016   Subjective Data:  51 y.o Caucasian male, single, lives at an [facility] house. Background history of SUD and multiple medical comorbidity. Self presented to the ER seeking help. 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 Reports increasing suicidal thoughts. No plans to act on the thoughts. UDS was positive for both substances. Feels disappointed he relapsed. Peer pressure precipitated this. Had support from NA but did not use it. Wants to get help. Open to long term rehab. Feels he would not be accepted back at the [place] house. No associated hallucinations. No delusional preoccupation. No thoughts of violence.  No access to weapons. Patient reports developing diarrhea  recently. No associated systemic symptoms.  I discussed his medications with him. We agreed to continue current regimen. We agreed to discontinue Amitriptyline and add Mirtazapine at bedtime.   Continued Clinical Symptoms:  Alcohol Use Disorder Identification Test Final Score (AUDIT): 14 The "Alcohol Use Disorders Identification Test", Guidelines for Use in Primary Care, Second Edition.  World Science writer [facility]). Score between 0-7:  no or low risk or alcohol related problems. Score between 8-15:  moderate risk of alcohol related problems. Score between 16-19:  high risk of alcohol related problems. Score 20 or above:  warrants further diagnostic evaluation for alcohol dependence and treatment.   CLINICAL FACTORS:   Alcohol/Substance Abuse/Dependencies   Musculoskeletal: Strength & Muscle Tone: Unable to assess at this time.  Gait & Station: Unable to assess at this time.  Patient leans: Unable to assess at this time.   Psychiatric Specialty Exam: Physical Exam  ROS  Blood pressure 109/76, pulse (!) 104, temperature 97.8 F (36.6 C), temperature source Oral, resp. rate 18, height 5\' 11"  (1.803 m), weight 87.1 kg (192 lb), SpO2 100 %.Body mass index is 26.78 kg/m.  General Appearance: In bed, malodorous, poor grooming. Limited engagement. Underlying irritability.   Eye Contact:  Poor  Speech:  Hesitates a bit. Normal tone  Volume:  Normal  Mood:  Dysphoric and Irritable  Affect:  Congruent and Restricted  Thought Process:  Linear  Orientation:  Full (Time, Place, and Person)  Thought Content:  Rumination  Suicidal Thoughts:  No  Homicidal Thoughts:  No  Memory:  Immediate;   Poor Recent;   Poor Remote;   Fair  Judgement:  Fair  Insight:  Good  Psychomotor Activity:  Decreased  Concentration:  Concentration: Poor and Attention Span: Poor  Recall:  Poor  Fund of Knowledge:  Fair  Language:  Fair  Akathisia:  Negative  Handed:    AIMS (if indicated):     Assets:   Desire for Improvement Resilience  ADL's:  Impaired  Cognition:  Impaired,  Mild  Sleep:         COGNITIVE FEATURES THAT CONTRIBUTE TO RISK:  Loss of executive function    SUICIDE RISK:   Minimal: No identifiable suicidal ideation.  Patients presenting with no risk factors but with morbid ruminations; may be classified as minimal risk based on the severity of the depressive symptoms  PLAN OF CARE:   1. Suicide precautions 2. Continue home medications at current dose 3. Discontinue Amitriptyline  4. Mirtazapine 15 mg at bedtime.   I certify that inpatient services furnished can reasonably be expected to improve the patient's condition.   Georgiann Cocker, MD 01/31/2017, 2:39 PM

## 2017-01-31 NOTE — Progress Notes (Signed)
D- Patient alert and oriented. Patient presents in a depressed mood with an anxious and depressed affect.  Patient currently denies SI/HI/AVH.  Patient has complaints of increased anxiety and states "I'm just trying to calm my nerves".  Patient reports hx of neuropathy and states "my feet and legs are aching".  Patient reports poor sleep and requested medications for sleep and nerve pain.  PA notified and medications given per MD order.  See MAR.   A- Scheduled and PRN medications administered to patient, per physician orders. Support and encouragement provided.  Routine safety checks conducted every 15 minutes.  Patient informed to notify staff with problems or concerns. R- No adverse drug reactions noted. Patient contracts for safety at this time. Patient compliant with medications and treatment plan.  Patient remains safe at this time.

## 2017-01-31 NOTE — BHH Counselor (Signed)
CSW attempted to complete PSA with pt. Pt was in bed in fetal position and stated that he doesn't feel well and his stomach has been "acting up". Pt states that he has been back and forth to the bathroom all day. Pt's room smells of feces and there was feces smeared on the bench in pt's room. Pt requested that CSW return at a later time. CSW agreed. CSW informed staff of feces smeared on bench in pt's room. Pt's roommate is also bothered by this and reported to this writer in reference to the pt, "He has been defecating everywhere in the room".  Jonathon Jordan, MSW, Theresia Majors [phone]

## 2017-02-01 ENCOUNTER — Ambulatory Visit: Payer: Medicare Other | Admitting: Sports Medicine

## 2017-02-01 NOTE — Progress Notes (Deleted)
  OFFICE VISIT NOTE Jerry Shepherd. [facility] [phone]  Jerry Shepherd - 51 y.o. male MRN [id number]  Date of birth: Nov 16, 1965  Visit Date: 02/01/2017  PCP: Helane Rima, DO   Referred by: Helane Rima, DO  {Scribe Credentials} acting as scribe for Dr. Berline Chough.  SUBJECTIVE:  No chief complaint on file.  HPI: As below and per problem based documentation when appropriate.  {Location/radiation} {Onset/duration} {Mechanism/Context}  The pain is described as {character} and is rated as {severity}.  Worsened with {exacerbating} Improves with {alleviating} Therapies tried include {therapies}  Other associated symptoms include: {none}    ROS  Otherwise per HPI.  HISTORY & PERTINENT PRIOR DATA:  The 10-year ASCVD risk score Jerry Shepherd., et al., 2013) is: 18.7%   Values used to calculate the score:     Age: 71 years     Sex: Male     Is Non-Hispanic African American: No     Diabetic: Yes     Tobacco smoker: Yes     Systolic Blood Pressure: 136 mmHg     Is BP treated: No     HDL Cholesterol: 39.7 mg/dL     Total Cholesterol: 167 mg/dL He reports that he has been smoking Cigarettes.  He has a 20.00 pack-year smoking history. He has never used smokeless tobacco.  Recent Labs  04/19/16 0840 10/04/16 0925 01/24/17 1446  HGBA1C 11.4* 6.6 6.9   Medications & Allergies reviewed per EMR Patient Active Problem List   Diagnosis Date Noted  . 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 Substance induced mood disorder (HCC) 01/30/2017  . Major depressive disorder, recurrent severe without psychotic features (HCC) 01/30/2017  . HLD (hyperlipidemia) 01/24/2017  . Right shoulder pain 01/24/2017  . PAD (peripheral artery disease) (HCC) 12/27/2016  . Welcome to Medicare preventive visit 10/24/2016  . Polyneuropathy associated with underlying disease (HCC) 10/04/2016  . Nicotine dependence 10/04/2016  . DM (diabetes mellitus), type  2 with neurological complications (HCC) 10/04/2016  . Decreased libido 10/04/2016  . Chronic midline low back pain 10/04/2016   Past Medical History:  Diagnosis Date  . Chronic midline low back pain 10/04/2016  . Decreased libido 10/04/2016  . Depression   . Depression, major, single episode, moderate (HCC) 10/04/2016  . 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 Polyneuropathy associated with underlying disease (HCC) 10/04/2016  . Smoker 10/04/2016   Family History  Problem Relation Age of Onset  . Diabetes Brother   . Cancer Brother   . Heart disease Father    Past Surgical History:  Procedure Laterality Date  . HERNIA REPAIR     Social History   Occupational History  . Disabled    Social History Main Topics  . Smoking status: Current Every Day Smoker    Packs/day: 1.00    Years: 20.00    Types: Cigarettes  . Smokeless tobacco: Never Used  . Alcohol use No  . Drug use: No  . Sexual activity: Not on file    OBJECTIVE:  VS:  HT:    WT:   BMI:     BP:   HR: bpm  TEMP: ( )  RESP:  EXAM: No additional findings.    No results found. ASSESSMENT & PLAN:  { }

## 2017-02-01 NOTE — Progress Notes (Signed)
Nursing Progress Note: 7p-7a D: Pt currently presents with a sad/depressed/anxious affect and behavior. Pt states "I feel a lot better today. I am actually making an effort to walk around. That Imodium cured me. I feel more comfortable attending groups." Interacting appropriately with the milieu. Pt reports good sleep during the previous night with current medication regimen. Pt did not attend wrap-up group.  A: Pt provided with medications per providers orders. Pt's labs and vitals were monitored throughout the night. Pt supported emotionally and encouraged to express concerns and questions. Pt educated on medications.  R: Pt's safety ensured with 15 minute and environmental checks. Pt currently denies SI, HI, and AVH. Pt verbally contracts to seek staff if SI,HI, or AVH occurs and to consult with staff before acting on any harmful thoughts. Will continue to monitor.
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
## 2017-02-01 NOTE — Social Work (Signed)
Referral made to [facility], per [facility], patient is under review.  Santa Genera, LCSW Lead Clinical Social Worker Phone:  [phone]

## 2017-02-01 NOTE — Progress Notes (Signed)
Recreation Therapy Notes  Date:  02/01/17 Time: 0930 Location: 500 Hall Dayroom  Group Topic: Stress Management  Goal Area(s) Addresses:  Patient will verbalize importance of using healthy stress management.  Patient will identify positive emotions associated with healthy stress management.   Intervention: Stress Management  Activity :  Guided Imagery.  LRT introduced the stress management technique of guided imagery.  LRT read a script so patients could follow along and imagine being in their peaceful place.  Patients were to follow along as LRT read the script to engage in the activity.  Education: Stress Management, Discharge Planning.   Education Outcome: Acknowledges edcuation/In group clarification offered/Needs additional education  Clinical Observations/Feedback:  Pt did not attend group.    Othon Guardia, LRT/CTRS        Maygan Koeller A 02/01/2017 12:32 PM 

## 2017-02-01 NOTE — [facility] Group Notes (Signed)
[facility] 02/01/2017 1:15pm  Type of Therapy: Mental Health Association Presentation  Participation Level: Pt invited. Did not attend. In bed sleeping.   Jonathon JordanLynn B Aftan Vint, MSW, LCSWA 02/01/2017 2:31 PM

## 2017-02-01 NOTE — Progress Notes (Signed)
[facility] MD Progress Note  02/01/2017 12:22 PM Jerry Shepherd  MRN:  [id number] Subjective:   51 y.o Caucasian male, single, lives at an [place] [facility]. Background history of SUD and multiple medical comorbidity. Self presented to the ER seeking help. 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 Reports increasing suicidal thoughts. No plans to act on the thoughts. UDS was positive for both substances. Feels disappointed he relapsed.  Chart reviewed today. Patient discussed at team today  Staff reports that he has been in his room most of the time. Diarrhea is resolving. No systemic symptoms. Vitals has been stable. He has not exhibited any odd behavior. He has not reported any abnormal perception. He attended one of the groups today.   Seen today. Feels better today. Gastroenteritis is resolving. Glad he came in here to get treatment. Says his goal is to stay sober and get back into sober living. The rules requires that he is out of [facility] for two weeks before he can be accepted back. Patient hopes to get into rehab from here. He is open to [facility] referral. Patient has support from AA. He has a sponsor. Says he enjoyed the meeting here. He wants to stay sober. No current craving for substances. No hallucination in any modality. No delusional theme. No overwhelming anxiety. No violent thoughts. No suicidal or homicidal thoughts.   Principal Problem: Major depressive disorder, recurrent severe without psychotic features (HCC) Diagnosis:   Patient Active Problem List   Diagnosis Date Noted  . 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 Substance induced mood disorder (HCC) [F19.94] 01/30/2017  . Major depressive disorder, recurrent severe without psychotic features (HCC) [F33.2] 01/30/2017  . HLD (hyperlipidemia) [E78.5] 01/24/2017  . Right shoulder pain [M25.511] 01/24/2017  . PAD (peripheral artery disease) (HCC) [I73.9] 12/27/2016  . Welcome to Medicare  preventive visit [Z00.00] 10/24/2016  . Polyneuropathy associated with underlying disease (HCC) [G63] 10/04/2016  . Nicotine dependence [F17.200] 10/04/2016  . 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 Decreased libido [R68.82] 10/04/2016  . Chronic midline low back pain [M54.5, G89.29] 10/04/2016   Total Time spent with patient: 20 minutes  Past Psychiatric History: As in H&P  Past Medical History:  Past Medical History:  Diagnosis Date  . Chronic midline low back pain 10/04/2016  . Decreased libido 10/04/2016  . Depression   . Depression, major, single episode, moderate (HCC) 10/04/2016  . 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 Polyneuropathy associated with underlying disease (HCC) 10/04/2016  . Smoker 10/04/2016    Past Surgical History:  Procedure Laterality Date  . HERNIA REPAIR     Family History:  Family History  Problem Relation Age of Onset  . Diabetes Brother   . Cancer Brother   . Heart disease Father    Family Psychiatric  History: As in H&P Social History:  History  Alcohol Use No     History  Drug Use No    Social History   Social History  . Marital status: Single    Spouse name: N/A  . Number of children: N/A  . Years of education: N/A   Occupational History  . Disabled    Social History Main Topics  . Smoking status: Current Every Day Smoker    Packs/day: 1.00    Years: 20.00    Types: Cigarettes  . Smokeless tobacco: Never Used  . Alcohol use No  . Drug  use: No  . Sexual activity: Not Asked   Other Topics Concern  . None   Social History Narrative  . None   Additional Social History:        Sleep: Good  Appetite:  Fair  Current Medications: Current Facility-Administered Medications  Medication Dose Route Frequency Provider Last Rate Last Dose  . acetaminophen (TYLENOL) tablet 650 mg  650 mg Oral Q6H PRN Charm RingsLord, Jamison Y, NP   650 mg at  01/30/17 2132  . gabapentin (NEURONTIN) capsule 1,200 mg  1,200 mg Oral QHS Donell SievertSimon, Spencer E, PA-C   1,200 mg at 01/31/17 2206  . hydrOXYzine (ATARAX/VISTARIL) tablet 25 mg  25 mg Oral TID PRN Beryle Lathekonkwo, Justina A, NP   25 mg at 01/30/17 2133  . loperamide (IMODIUM) capsule 4 mg  4 mg Oral PRN Donell SievertSimon, Spencer E, PA-C   4 mg at 01/31/17 2206  . magnesium hydroxide (MILK OF MAGNESIA) suspension 30 mL  30 mL Oral Daily PRN Charm RingsLord, Jamison Y, NP      . metFORMIN (GLUCOPHAGE) tablet 500 mg  500 mg Oral BID WC Money, Gerlene Burdockravis B, FNP   500 mg at 02/01/17 0926  . mirtazapine (REMERON) tablet 15 mg  15 mg Oral QHS Izediuno, Delight OvensVincent A, MD   15 mg at 01/31/17 2206  . nicotine (NICODERM CQ - dosed in mg/24 hours) patch 21 mg  21 mg Transdermal Daily Charm RingsLord, Jamison Y, NP   21 mg at 02/01/17 0800  . ondansetron (ZOFRAN-ODT) disintegrating tablet 4 mg  4 mg Oral Q8H PRN Money, Gerlene Burdockravis B, FNP      . pneumococcal 23 valent vaccine (PNU-IMMUNE) injection 0.5 mL  0.5 mL Intramuscular Tomorrow-1000 Cobos, Fernando A, MD      . rosuvastatin (CRESTOR) tablet 10 mg  10 mg Oral q1800 Charm RingsLord, Jamison Y, NP   10 mg at 01/31/17 1840  . traZODone (DESYREL) tablet 50 mg  50 mg Oral QHS PRN Charm RingsLord, Jamison Y, NP   50 mg at 01/30/17 2133    Lab Results: No results found for this or any previous visit (from the past 48 hour(s)).  Blood Alcohol level:  Lab Results  Component Value Date   ETH <5 01/29/2017    Metabolic Disorder Labs: Lab Results  Component Value Date   HGBA1C 6.9 01/24/2017   MPG 280 04/19/2016   No results found for: PROLACTIN Lab Results  Component Value Date   CHOL 167 10/04/2016   TRIG 188.0 (H) 10/04/2016   HDL 39.70 10/04/2016   CHOLHDL 4 10/04/2016   VLDL 37.6 10/04/2016   LDLCALC 89 10/04/2016    Physical Findings: AIMS: Facial and Oral Movements Muscles of Facial Expression: None, normal Lips and Perioral Area: None, normal Jaw: None, normal Tongue: None, normal,Extremity Movements Upper  (arms, wrists, hands, fingers): None, normal Lower (legs, knees, ankles, toes): None, normal, Trunk Movements Neck, shoulders, hips: None, normal, Overall Severity Severity of abnormal movements (highest score from questions above): None, normal Incapacitation due to abnormal movements: None, normal Patient's awareness of abnormal movements (rate only patient's report): No Awareness, Dental Status Current problems with teeth and/or dentures?: No Does patient usually wear dentures?: No  CIWA:  CIWA-Ar Total: 3 COWS:     Musculoskeletal: Strength & Muscle Tone: within normal limits Gait & Station: normal Patient leans: N/A  Psychiatric Specialty Exam: Physical Exam  ROS  Blood pressure 110/80, pulse 89, temperature 98.1 F (36.7 C), temperature source Oral, resp. rate 18, height 5\' 11"  (1.803 m), weight  87.1 kg (192 lb), SpO2 100 %.Body mass index is 26.78 kg/m.  General Appearance: Up and about today. Engages much better today. Not in any distress. Does not appear internally stimulated.   Eye Contact:  Good  Speech:  More spontaneous. Low tone and rate  Volume:  Decreased  Mood: Less dysphoric  Affect:  More reactive though still restricted.   Thought Process:  Normal speed of thought. Logical and goal directed.   Orientation:  Full (Time, Place, and Person)  Thought Content:  No delusional theme. No preoccupation with violent thoughts. No negative ruminations. No obsession.  No hallucination in any modality.   Suicidal Thoughts:  No  Homicidal Thoughts:  No  Memory:  Immediate;   Good Recent;   Good Remote;   Good  Judgement:  Good  Insight:  Good  Psychomotor Activity:  Better today  Concentration:  Concentration: Fair and Attention Span: Good  Recall:  Good  Fund of Knowledge:  Good  Language:  Good  Akathisia:  Negative  Handed:    AIMS (if indicated):     Assets:  Desire for Improvement Financial Resources/Insurance Resilience Transportation  ADL's:  Intact   Cognition:  WNL  Sleep:  Number of Hours: 6.5   Assessment and Plan  Patient is coming off psychoactive substances well. No associated psychosis. No associated violent behavior. No suicidal or homicidal thoughts. He is motivated towards sobriety again. He needs further evaluation. We would explore locally available options with him.   Psychiatric: SUD  Medical: PAD DM complicated with neuropathy HTN HLD Chronic pain.  Psychosocial:  Limited support  PLAN: 1. Continue current regimen 2. Encourage unit groups and activities 3. Continue to monitor mood, behavior and interaction with peers 4. SW would help facilitate aftercare     Georgiann Cocker, MD 02/01/2017, 12:22 PM

## 2017-02-01 NOTE — Progress Notes (Addendum)
D) Pt. Affect blunted.  Pt. Very difficult to get OOB this am. Pt. Reports social anxiety and difficulty being around others.  Pt. Asked to have his treatment team meeting postponed to avoid getting up.  Pt. States he no longer has diarrhea.  Pt. Reports pain in feet, but declined medication.  Utilized heat x 1 for comfort.  Refused ice stating it makes pain worse.  Pt. Verbalized that he is looking for long term treatment.  Pt. Reports that he was at [facility] for several months and would like to return there after he gets more treatment.  A) Pt. Offered support and staff availability.  R) Pt. Remains isolated to his room.  Continues safe at this time.

## 2017-02-01 NOTE — Tx Team (Addendum)
Interdisciplinary Treatment and Diagnostic Plan Update  02/01/2017 Time of Session: 9:30 AM Jerry Shepherd MRN: [id number]  Principal Diagnosis: Major depressive disorder, recurrent severe without psychotic features (HCC)  Secondary Diagnoses: Principal Problem:   Major depressive disorder, recurrent severe without psychotic features (HCC) Active Problems:   Substance induced mood disorder (HCC)   Current Medications:  Current Facility-Administered Medications  Medication Dose Route Frequency Provider Last Rate Last Dose  . acetaminophen (TYLENOL) tablet 650 mg  650 mg Oral Q6H PRN Charm Rings, NP   650 mg at 01/30/17 2132  . gabapentin (NEURONTIN) capsule 1,200 mg  1,200 mg Oral QHS Donell Sievert E, PA-C   1,200 mg at 01/31/17 2206  . hydrOXYzine (ATARAX/VISTARIL) tablet 25 mg  25 mg Oral TID PRN Beryle Lathe, Justina A, NP   25 mg at 01/30/17 2133  . loperamide (IMODIUM) capsule 4 mg  4 mg Oral PRN Donell Sievert E, PA-C   4 mg at 01/31/17 2206  . magnesium hydroxide (MILK OF MAGNESIA) suspension 30 mL  30 mL Oral Daily PRN Charm Rings, NP      . metFORMIN (GLUCOPHAGE) tablet 500 mg  500 mg Oral BID WC Money, Gerlene Burdock, FNP   500 mg at 02/01/17 0926  . mirtazapine (REMERON) tablet 15 mg  15 mg Oral QHS Izediuno, Delight Ovens, MD   15 mg at 01/31/17 2206  . nicotine (NICODERM CQ - dosed in mg/24 hours) patch 21 mg  21 mg Transdermal Daily Charm Rings, NP   21 mg at 02/01/17 0800  . ondansetron (ZOFRAN-ODT) disintegrating tablet 4 mg  4 mg Oral Q8H PRN Money, Gerlene Burdock, FNP      . pneumococcal 23 valent vaccine (PNU-IMMUNE) injection 0.5 mL  0.5 mL Intramuscular Tomorrow-1000 Cobos, Fernando A, MD      . rosuvastatin (CRESTOR) tablet 10 mg  10 mg Oral q1800 Charm Rings, NP   10 mg at 01/31/17 1840  . traZODone (DESYREL) tablet 50 mg  50 mg Oral QHS PRN Charm Rings, NP   50 mg at 01/30/17 2133   PTA Medications: Prescriptions Prior to Admission  Medication Sig Dispense Refill  Last Dose  . amitriptyline (ELAVIL) 50 MG tablet Take 1 tablet (50 mg total) by mouth at bedtime. (Patient taking differently: Take 50 mg by mouth at bedtime as needed for sleep. ) 30 tablet 1 Past Week at Unknown time  . HYDROcodone-acetaminophen (NORCO/VICODIN) 5-325 MG tablet Take 1 tablet by mouth 4 (four) times daily as needed for moderate pain. 60 tablet 0 01/28/2017 at Unknown time  . metFORMIN (GLUCOPHAGE) 1000 MG tablet Take 1 tablet (1,000 mg total) by mouth 2 (two) times daily with a meal. 60 tablet 1 01/28/2017 at Unknown time  . pregabalin (LYRICA) 150 MG capsule Take 1 capsule (150 mg total) by mouth 2 (two) times daily. (Patient not taking: Reported on 01/29/2017) 180 capsule 5 Not Taking at Unknown time  . rosuvastatin (CRESTOR) 10 MG tablet Take 1 tablet (10 mg total) by mouth daily. 90 tablet 3 01/28/2017 at Unknown time  . traZODone (DESYREL) 50 MG tablet Take 50 mg by mouth at bedtime as needed for sleep  0 unk at Unknown time    Patient Stressors: Financial difficulties Substance abuse  Patient Strengths: Wellsite geologist fund of knowledge Physical Health  Treatment Modalities: Medication Management, Group therapy, Case management,  1 to 1 session with clinician, Psychoeducation, Recreational therapy.   Physician Treatment Plan for Primary Diagnosis: Major depressive  disorder, recurrent severe without psychotic features (HCC) Long Term Goal(s): Improvement in symptoms so as ready for discharge Improvement in symptoms so as ready for discharge   Short Term Goals: Ability to verbalize feelings will improve Ability to disclose and discuss suicidal ideas Ability to maintain clinical measurements within normal limits will improve Compliance with prescribed medications will improve  Medication Management: Evaluate patient's response, side effects, and tolerance of medication regimen.  Therapeutic Interventions: 1 to 1 sessions, Unit Group sessions and Medication  administration.  Evaluation of Outcomes: Progressing  Physician Treatment Plan for Secondary Diagnosis: Principal Problem:   Major depressive disorder, recurrent severe without psychotic features (HCC) Active Problems:   Substance induced mood disorder (HCC)  Long Term Goal(s): Improvement in symptoms so as ready for discharge Improvement in symptoms so as ready for discharge   Short Term Goals: Ability to verbalize feelings will improve Ability to disclose and discuss suicidal ideas Ability to maintain clinical measurements within normal limits will improve Compliance with prescribed medications will improve     Medication Management: Evaluate patient's response, side effects, and tolerance of medication regimen.  Therapeutic Interventions: 1 to 1 sessions, Unit Group sessions and Medication administration.  Evaluation of Outcomes: Progressing   RN Treatment Plan for Primary Diagnosis: Major depressive disorder, recurrent severe without psychotic features (HCC) Long Term Goal(s): Knowledge of disease and therapeutic regimen to maintain health will improve  Short Term Goals: Ability to demonstrate self-control, Ability to verbalize feelings will improve, Ability to disclose and discuss suicidal ideas and Ability to identify and develop effective coping behaviors will improve  Medication Management: RN will administer medications as ordered by provider, will assess and evaluate patient's response and provide education to patient for prescribed medication. RN will report any adverse and/or side effects to prescribing provider.  Therapeutic Interventions: 1 on 1 counseling sessions, Psychoeducation, Medication administration, Evaluate responses to treatment, Monitor vital signs and CBGs as ordered, Perform/monitor CIWA, COWS, AIMS and Fall Risk screenings as ordered, Perform wound care treatments as ordered.  Evaluation of Outcomes: Progressing   LCSW Treatment Plan for Primary  Diagnosis: Major depressive disorder, recurrent severe without psychotic features (HCC) Long Term Goal(s): Safe transition to appropriate next level of care at discharge, Engage patient in therapeutic group addressing interpersonal concerns.  Short Term Goals: Engage patient in aftercare planning with referrals and resources, Increase ability to appropriately verbalize feelings, Facilitate patient progression through stages of change regarding substance use diagnoses and concerns, Identify triggers associated with mental health/substance abuse issues and Increase skills for wellness and recovery  Therapeutic Interventions: Assess for all discharge needs, 1 to 1 time with Social worker, Explore available resources and support systems, Assess for adequacy in community support network, Educate family and significant other(s) on suicide prevention, Complete Psychosocial Assessment, Interpersonal group therapy.  Evaluation of Outcomes: Progressing   Progress in Treatment: Attending groups: No. Participating in groups: No. Taking medication as prescribed: Yes. Toleration medication: Yes. Family/Significant other contact made: No, will contact:  collateral w patient consent Patient understands diagnosis: Yes. Discussing patient identified problems/goals with staff: Yes. Medical problems stabilized or resolved: No. and As evidenced by:  continue to monitor withdrawal symptoms, diarrhea improviing Denies suicidal/homicidal ideation: Yes. and As evidenced by:  contracts for safety on unit, coping skills inadequate for community stressors Issues/concerns per patient self-inventory: No. Other: NA  New problem(s) identified: No, Describe:  patient requesting residential treatment options  New Short Term/Long Term Goal(s):  "get better" but was unable to be more specific; "  quit  Using drugs" "coping skills to maintain sobriety", expressed "pain/disappointment" over recent relapse that led to loss of sober  housing situation  Discharge Plan or Barriers: cannot return to [facility], cannot identify resources in community to support recovery goal  Patient Goal: "to detox."   Reason for Continuation of Hospitalization: Anxiety Depression Medication stabilization Suicidal ideation  Estimated Length of Stay:  3 - 5 days; 02/06/17  Attendees: Patient:  Jerry Shepherd 02/01/2017 11:37 AM  Physician: Cristino Martes MD 02/01/2017 11:37 AM  Nursing: Lockie Pares RN 02/01/2017 11:37 AM  RN Care Manager:  Sondra Barges RN CM 02/01/2017 11:37 AM  Social Worker:  Governor Rooks LCSW 02/01/2017 11:37 AM  Recreational Therapist:  02/01/2017 11:37 AM  Other:  02/01/2017 11:37 AM  Other:  02/01/2017 11:37 AM  Other: 02/01/2017 11:37 AM    Scribe for Treatment Team: Sallee Lange, LCSW 02/01/2017 11:37 AM

## 2017-02-02 LAB — GLUCOSE, CAPILLARY
Glucose-Capillary: 199 mg/dL — ABNORMAL HIGH (ref 65–99)
Glucose-Capillary: 152 mg/dL — ABNORMAL HIGH (ref 65–99)

## 2017-02-02 MED ORDER — METFORMIN HCL 500 MG PO TABS
1000.0000 mg | ORAL_TABLET | Freq: Two times a day (BID) | ORAL | Status: DC
  Administered 2017-02-02 – 2017-02-03 (×2): 1000 mg via ORAL
  Filled 2017-02-02 (×6): qty 2

## 2017-02-02 NOTE — Progress Notes (Signed)
D: Patient denies SI, HI or AVH.  Pt. Presents with flat affect and depressed mood.  He returned to his bed after breakfast and was rather tired upon interaction.  He stated that it took him "2 hours" to fall asleep last night and that is why he is tired today.  Pt. States that his appetite is good and reports that the diarrhea he was having yesterday has resolved.  Pt. Did not attend morning group about leisure and lifestyle changes.  A: Patient given emotional support from RN. Patient encouraged to come to staff with concerns and/or questions. Patient's medication routine continued. Patient's orders and plan of care reviewed.   R: Patient remains appropriate and cooperative. Will continue to monitor patient q15 minutes for safety.

## 2017-02-02 NOTE — [facility] Group Notes (Signed)
LCSW Group Therapy Note  02/02/2017 1:15pm  Type of Therapy/Topic:  Group Therapy:  Balance in Life  Participation Level:  Did Not Attend  Description of Group:    This group will address the concept of balance and how it feels and looks when one is unbalanced. Patients will be encouraged to process areas in their lives that are out of balance and identify reasons for remaining unbalanced. Facilitators will guide patients in utilizing problem-solving interventions to address and correct the stressor making their life unbalanced. Understanding and applying boundaries will be explored and addressed for obtaining and maintaining a balanced life. Patients will be encouraged to explore ways to assertively make their unbalanced needs known to significant others in their lives, using other group members and facilitator for support and feedback.  Therapeutic Goals: 1. Patient will identify two or more emotions or situations they have that consume much of in their lives. 2. Patient will identify signs/triggers that life has become out of balance:  3. Patient will identify two ways to set boundaries in order to achieve balance in their lives:  4. Patient will demonstrate ability to communicate their needs through discussion and/or role plays  Summary of Patient Progress:      Therapeutic Modalities:   Cognitive Behavioral Therapy Solution-Focused Therapy Assertiveness Training  Rondall AllegraCandace L Dorlis Judice, LCSW 02/02/2017 2:33 PM

## 2017-02-02 NOTE — Progress Notes (Signed)
[facility] MD Progress Note  02/02/2017 3:30 PM Jerry Shepherd  MRN:  [id number] Subjective:   51 y.o Caucasian male, single, lives at an [place] house. Background history of SUD and multiple medical comorbidity. Self presented to the ER seeking help. 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 Reports increasing suicidal thoughts. No plans to act on the thoughts. UDS was positive for both substances. Feels disappointed he relapsed.  Chart reviewed today. Patient discussed at team today. Would explore [facility] for tomorrow.   Staff reports that he is more interactive. Diarrhea has resolved. He has not voiced any suicidal thoughts or violent thoughts. He has not been observed to be internally stimulated. .   Seen today. Mentally feeling fine. No evidence of psychosis. No evidence of mania. Not depressed. Was watching a movie just prior to interview. Focused on his diabetic neuropathy. Used to get opiates for it. Understands we cannot give him opiates here. No suicidal thoughts. No homicidal thoughts.  I discussed possibility of [facility] tomorrow. Patient is receptive to the idea.    Principal Problem: Major depressive disorder, recurrent severe without psychotic features (HCC) Diagnosis:   Patient Active Problem List   Diagnosis Date Noted  . 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 Substance induced mood disorder (HCC) [F19.94] 01/30/2017  . Major depressive disorder, recurrent severe without psychotic features (HCC) [F33.2] 01/30/2017  . HLD (hyperlipidemia) [E78.5] 01/24/2017  . Right shoulder pain [M25.511] 01/24/2017  . PAD (peripheral artery disease) (HCC) [I73.9] 12/27/2016  . Welcome to Medicare preventive visit [Z00.00] 10/24/2016  . Polyneuropathy associated with underlying disease (HCC) [G63] 10/04/2016  . Nicotine dependence [F17.200] 10/04/2016  . 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 Decreased  libido [R68.82] 10/04/2016  . Chronic midline low back pain [M54.5, G89.29] 10/04/2016   Total Time spent with patient: 20 minutes  Past Psychiatric History: As in H&P  Past Medical History:  Past Medical History:  Diagnosis Date  . Chronic midline low back pain 10/04/2016  . Decreased libido 10/04/2016  . Depression   . Depression, major, single episode, moderate (HCC) 10/04/2016  . 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 Polyneuropathy associated with underlying disease (HCC) 10/04/2016  . Smoker 10/04/2016    Past Surgical History:  Procedure Laterality Date  . HERNIA REPAIR     Family History:  Family History  Problem Relation Age of Onset  . Diabetes Brother   . Cancer Brother   . Heart disease Father    Family Psychiatric  History: As in H&P Social History:  History  Alcohol Use No     History  Drug Use No    Social History   Social History  . Marital status: Single    Spouse name: N/A  . Number of children: N/A  . Years of education: N/A   Occupational History  . Disabled    Social History Main Topics  . Smoking status: Current Every Day Smoker    Packs/day: 1.00    Years: 20.00    Types: Cigarettes  . Smokeless tobacco: Never Used  . Alcohol use No  . Drug use: No  . Sexual activity: Not Asked   Other Topics Concern  . None   Social History Narrative  . None   Additional Social History:        Sleep: Good  Appetite:  Fair  Current Medications: Current Facility-Administered Medications  Medication Dose Route Frequency  Provider Last Rate Last Dose  . acetaminophen (TYLENOL) tablet 650 mg  650 mg Oral Q6H PRN Lord, Jamison Y, NP   650 mg at 02/01/17 1656  . gabapentin (NEURONTIN) capsule 1,200 mg  1,200 mg OralCharm Rings QHS Donell SievertSimon, Spencer E, PA-C   1,200 mg at 02/01/17 2155  . hydrOXYzine (ATARAX/VISTARIL) tablet 25 mg  25 mg Oral TID PRN Ferne Reuskonkwo, Justina A, NP   25 mg at 02/01/17 1853  . loperamide (IMODIUM)  capsule 4 mg  4 mg Oral PRN Donell SievertSimon, Spencer E, PA-C   4 mg at 01/31/17 2206  . magnesium hydroxide (MILK OF MAGNESIA) suspension 30 mL  30 mL Oral Daily PRN Charm RingsLord, Jamison Y, NP      . metFORMIN (GLUCOPHAGE) tablet 1,000 mg  1,000 mg Oral BID WC Money, Travis B, FNP      . mirtazapine (REMERON) tablet 15 mg  15 mg Oral QHS Loretto Belinsky A, MD   15 mg at 02/01/17 2156  . nicotine (NICODERM CQ - dosed in mg/24 hours) patch 21 mg  21 mg Transdermal Daily Charm RingsLord, Jamison Y, NP   21 mg at 02/02/17 [id number]  . ondansetron (ZOFRAN-ODT) disintegrating tablet 4 mg  4 mg Oral Q8H PRN Money, Gerlene Burdockravis B, FNP      . pneumococcal 23 valent vaccine (PNU-IMMUNE) injection 0.5 mL  0.5 mL Intramuscular Tomorrow-1000 Cobos, Fernando A, MD      . rosuvastatin (CRESTOR) tablet 10 mg  10 mg Oral q1800 Charm RingsLord, Jamison Y, NP   10 mg at 02/01/17 [id number]  . traZODone (DESYREL) tablet 50 mg  50 mg Oral QHS PRN Charm RingsLord, Jamison Y, NP   50 mg at 02/01/17 2156    Lab Results:  Results for orders placed or performed during the hospital encounter of 01/30/17 (from the past 48 hour(s))  Glucose, capillary     Status: Abnormal   Collection Time: 02/02/17 12:04 PM  Result Value Ref Range   Glucose-Capillary 199 (H) 65 - 99 mg/dL   Comment 1 Notify RN     Blood Alcohol level:  Lab Results  Component Value Date   ETH <5 01/29/2017    Metabolic Disorder Labs: Lab Results  Component Value Date   HGBA1C 6.9 01/24/2017   MPG 280 04/19/2016   No results found for: PROLACTIN Lab Results  Component Value Date   CHOL 167 10/04/2016   TRIG 188.0 (H) 10/04/2016   HDL 39.70 10/04/2016   CHOLHDL 4 10/04/2016   VLDL 37.6 10/04/2016   LDLCALC 89 10/04/2016    Physical Findings: AIMS: Facial and Oral Movements Muscles of Facial Expression: None, normal Lips and Perioral Area: None, normal Jaw: None, normal Tongue: None, normal,Extremity Movements Upper (arms, wrists, hands, fingers): None, normal Lower (legs, knees, ankles, toes):  None, normal, Trunk Movements Neck, shoulders, hips: None, normal, Overall Severity Severity of abnormal movements (highest score from questions above): None, normal Incapacitation due to abnormal movements: None, normal Patient's awareness of abnormal movements (rate only patient's report): No Awareness, Dental Status Current problems with teeth and/or dentures?: No Does patient usually wear dentures?: No  CIWA:  CIWA-Ar Total: 3 COWS:     Musculoskeletal: Strength & Muscle Tone: within normal limits Gait & Station: normal Patient leans: N/A  Psychiatric Specialty Exam: Physical Exam  ROS  Blood pressure 124/82, pulse 94, temperature 100.3 F (37.9 C), temperature source Oral, resp. rate 18, height 5\' 11"  (1.803 m), weight 87.1 kg (192 lb), SpO2 100 %.Body mass index is 26.78 kg/m.  General Appearance: Neatly dressed. Calm and cooperative. Appropriate behavior. Not internally distracted.   Eye Contact:  Good  Speech:  Spontaneous, normal prosody. Normal tone and rate.   Volume:  Normal  Mood: Euthymic  Affect:  Full range and appropriate   Thought Process:  Normal speed of thought. Logical and goal directed.   Orientation:  Full (Time, Place, and Person)  Thought Content:  No delusional theme. No preoccupation with violent thoughts. No negative ruminations. No obsession.  No hallucination in any modality.   Suicidal Thoughts:  No  Homicidal Thoughts:  No  Memory:  Immediate;   Good Recent;   Good Remote;   Good  Judgement:  Good  Insight:  Good  Psychomotor Activity:  Normal  Concentration:  WNL  Recall:  Good  Fund of Knowledge:  Good  Language:  Good  Akathisia:  Negative  Handed:    AIMS (if indicated):     Assets:  Desire for Improvement Financial Resources/Insurance Resilience Transportation  ADL's:  Intact  Cognition:  WNL  Sleep:  Number of Hours: 6.75   Assessment and Plan  Patient has completely come off psychoactive substances. He is not a danger to  himself or others. Housing is his main issues at this time. No associated psychosis, mania or depression. Hopeful discharge tomorrow.    Psychiatric: SUD  Medical: PAD DM complicated with neuropathy HTN HLD Chronic pain.  Psychosocial:  Limited support  PLAN: 1. Continue current regimen 2. Continue to monitor mood, behavior and interaction with peers     Georgiann Cocker, MD 02/02/2017, 3:30 PMPatient ID: Jerry Shepherd, male   DOB: Oct 05, 1965, 51 y.o.   MRN: [id number]

## 2017-02-02 NOTE — Progress Notes (Signed)
Nursing Progress Note: 7p-7a D: Pt currently presents with a improved/depressed/brightens on approach affect and behavior. Pt states "I want a bus pass when I leave here [facility] my [facility]. I need to go get it as soon as possible so the shop won't charge me extra." Interacting appropriately with the milieu. Pt reports goos sleep during the previous night with current medication regimen.   A: Pt provided with medications per providers orders. Pt's labs and vitals were monitored throughout the night. Pt supported emotionally and encouraged to express concerns and questions. Pt educated on medications.  R: Pt's safety ensured with 15 minute and environmental checks. Pt currently denies SI, HI, and AVH. Pt verbally contracts to seek staff if SI,HI, or AVH occurs and to consult with staff before acting on any harmful thoughts. Will continue to monitor.

## 2017-02-03 MED ORDER — ROSUVASTATIN CALCIUM 10 MG PO TABS: 10.0000 mg | ORAL_TABLET | Freq: Every day | ORAL | 0 refills | Status: DC

## 2017-02-03 MED ORDER — MIRTAZAPINE 15 MG PO TABS: 15.0000 mg | ORAL_TABLET | Freq: Every day | ORAL | 0 refills | Status: AC

## 2017-02-03 MED ORDER — GABAPENTIN 400 MG PO CAPS: 1200.0000 mg | ORAL_CAPSULE | Freq: Every day | ORAL | 0 refills | Status: DC

## 2017-02-03 MED ORDER — METFORMIN HCL 1000 MG PO TABS: 1000.0000 mg | ORAL_TABLET | Freq: Two times a day (BID) | ORAL | 0 refills | Status: DC

## 2017-02-03 MED ORDER — HYDROXYZINE HCL 25 MG PO TABS: 25.0000 mg | ORAL_TABLET | Freq: Three times a day (TID) | ORAL | 0 refills | Status: DC | PRN

## 2017-02-03 MED ORDER — TRAZODONE HCL 50 MG PO TABS: 50.0000 mg | ORAL_TABLET | Freq: Every evening | ORAL | 0 refills | Status: DC | PRN

## 2017-02-03 NOTE — Progress Notes (Signed)
Nursing Discharge Note : Patient verbalizes for discharge. Denies  SI/HI / is not psychotic or delusional . D/c instructions read to pt. All belongings returned to pt who signed for same.  appointments discussed with pt. Prescriptions given,.   Pt verbalizes understanding.  Pt denies SI/HI/AVH. Bus pass given to pt.

## 2017-02-03 NOTE — BHH Suicide Risk Assessment (Signed)
[facility]   Principal Problem: Major depressive disorder, recurrent severe without psychotic features [facility](HCC) Discharge Diagnoses:  Patient Active Problem List   Diagnosis Date Noted  . 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 Substance induced mood disorder (HCC) [F19.94] 01/30/2017  . Major depressive disorder, recurrent severe without psychotic features (HCC) [F33.2] 01/30/2017  . HLD (hyperlipidemia) [E78.5] 01/24/2017  . Right shoulder pain [M25.511] 01/24/2017  . PAD (peripheral artery disease) (HCC) [I73.9] 12/27/2016  . Welcome to Medicare preventive visit [Z00.00] 10/24/2016  . Polyneuropathy associated with underlying disease (HCC) [G63] 10/04/2016  . Nicotine dependence [F17.200] 10/04/2016  . 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 Decreased libido [R68.82] 10/04/2016  . Chronic midline low back pain [M54.5, G89.29] 10/04/2016    Total Time spent with patient: 45 minutes  Musculoskeletal: Strength & Muscle Tone: within normal limits Gait & Station: normal Patient leans: N/A  Psychiatric Specialty Exam: Review of Systems  Constitutional: Negative.   HENT: Negative.   Eyes: Negative.   Respiratory: Negative.   Cardiovascular: Negative.   Gastrointestinal: Negative.   Genitourinary: Negative.   Musculoskeletal: Negative.   Skin: Negative.   Neurological: Negative.   Endo/Heme/Allergies: Negative.   Psychiatric/Behavioral: Negative for depression, hallucinations, memory loss and suicidal ideas. The patient is not nervous/anxious and does not have insomnia.     Blood pressure 124/82, pulse 94, temperature 100.3 F (37.9 C), temperature source Oral, resp. rate 18, height 5\' 11"  (1.803 m), weight 87.1 kg (192 lb), SpO2 100 %.Body mass index is 26.78 kg/m.  General Appearance: Neatly dressed, pleasant, engaging well and cooperative. Appropriate behavior. Not in any distress. Good relatedness.  Not internally stimulated  Eye Contact::  Good  Speech: Spontaneous, normal prosody. Normal tone and rate.   Volume:  Normal  Mood:  Euthymic  Affect:  Appropriate and Full Range  Thought Process:  Goal Directed and Linear  Orientation:  Full (Time, Place, and Person)  Thought Content:  No delusional theme. No preoccupation with violent thoughts. No negative ruminations. No obsession.  No hallucination in any modality.   Suicidal Thoughts:  No  Homicidal Thoughts:  No  Memory:  Immediate;   Good Recent;   Good Remote;   Good  Judgement:  Good  Insight:  Good  Psychomotor Activity:  Normal  Concentration:  Good  Recall:  Good  Fund of Knowledge:Good  Language: Good  Akathisia:  Negative  Handed:    AIMS (if indicated):     Assets:  Communication Skills Desire for Improvement Financial Resources/Insurance Resilience Social Support Transportation Vocational/Educational  Sleep:  Number of Hours: 6.75  Cognition: WNL  ADL's:  Intact   Clinical Assessment::   51 y.o Caucasian male, single, lives [facility]. Background history of SUD and multiple medical comorbidity. Self presented to the ER seeking help. 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 Reports increasing suicidal thoughts. No plans to act on the thoughts. UDS was positive for both substances. Feels disappointed he relapsed. Peer pressure precipitated this. Had support from NA but did not use it. Wants to get help. Open to long term rehab. Feels he would not be accepted back at the [place].   Repeatedly told us that he was never really suicidal. He wants to get back into sober living. [facility] return [place] after fourteen days of treatment. His motivation is to get  Back [place] [facility]. He has completely come  off substances. No residual withdrawal symptoms.  He had a phone interview with [facility] and [facility] today. He is not qualified for treatment at this time.  Patient is aware and plans to stay at the shelter until he can get back into [facility] housing. Reports that he is in good spirits. Not feeling depressed. Reports normal energy and interest. Has been maintaining normal biological functions. He is able to think clearly. He is able to focus on task. His thoughts are not crowded or racing. No evidence of mania. No hallucination in any modality. He is not making any delusional statement. No passivity of will/thought. He is fully in touch with reality. No thoughts of suicide. No thoughts of homicide. No violent thoughts. No overwhelming anxiety. No craving for substances. No access to weapons. Future oriented.   Nursing staff reports that patient has been appropriate on the unit. Patient has been interacting well with peers. No behavioral issues. Patient has not voiced any suicidal thoughts. Patient has not been observed to be internally stimulated. Patient has been adherent with treatment recommendations. Patient has been tolerating their medication well.   Patient was discussed at team. Team members feels that patient is back to his baseline level of function. Team agrees with plan to discharge patient today.    Demographic Factors:  Male and Living alone  Loss Factors: housing temprarily   Historical Factors: Impulsivity  Risk Reduction Factors:   Positive social support, Positive therapeutic relationship and Positive coping skills or problem solving skills  Continued Clinical Symptoms:  As above  Cognitive Features That Contribute To Risk:  None    Suicide Risk:  Minimal: No identifiable suicidal ideation. Patient is not having any thoughts of suicide at this time. Modifiable risk factors targeted during this admission includes depression and substance use. Demographical and historical risk factors cannot be modified. Patient is now engaging well. Patient is reliable and is future oriented. We have buffered patient's support structures. At  this point, patient is at low risk of suicide. Patient is aware of the effects of psychoactive substances on decision making process. Patient has been provided with emergency contacts. Patient acknowledges to use resources provided if unforseen circumstances changes their current risk stratification.   Follow-up Information    [facility] Follow up.   Specialty:  Behavioral Health Why:  You are not eligible for [facility] (do not meet inpatient criteria) or [facility] (medicaid out of county). Please follow-up with [facility] for medication management and outpatient services. Thank you.  Contact information: [address] [place] [address] [phone]           Plan Of Care/Follow-up recommendations:  1. Continue current psychotropic medications 2. Mental health and addiction follow up as arranged.  3. Provided limited quantity of prescriptions   Georgiann Cocker, MD 02/03/2017, 10:15 AM

## 2017-02-03 NOTE — Progress Notes (Signed)
Recreation Therapy Notes  Date: 02/03/17 Time: 0930 Location: 500 Hall Dayroom  Group Topic: Stress Management  Goal Area(s) Addresses:  Patient will verbalize importance of using healthy stress management.  Patient will identify positive emotions associated with healthy stress management.   Intervention: Stress Management  Activity :  Progressive Muscle Relaxation.  LRT introduced the stress management technique of progressive muscle relaxation.  Patients were to follow along as LRT read script to fully engage in the technique.  Education:  Stress Management, Discharge Planning.   Education Outcome: Acknowledges edcuation/In group clarification offered/Needs additional education  Clinical Observations/Feedback: Pt did not attend group.    Caroll RancherMarjette Porter Moes, LRT/CTRS         Caroll RancherLindsay, Alycen Mack A 02/03/2017 12:16 PM

## 2017-02-03 NOTE — Discharge Summary (Signed)
Physician Discharge Summary Note  Patient:  Jerry Shepherd is an 51 y.o., male MRN:  [id number] DOB:  [birth year] Patient phone:  [phone] (home)  Patient address:   [address] [place] [address],  Total Time spent with patient: 20 minutes  Date of Admission:  01/30/2017 Date of Discharge: 02/03/17  Reason for Admission:  Worsening depression with SI and substance abuse  Principal Problem: Major depressive disorder, recurrent severe without psychotic features [facility]) Discharge Diagnoses: Patient Active Problem List   Diagnosis Date Noted  . 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 Substance induced mood disorder (HCC) [F19.94] 01/30/2017  . Major depressive disorder, recurrent severe without psychotic features (HCC) [F33.2] 01/30/2017  . HLD (hyperlipidemia) [E78.5] 01/24/2017  . Right shoulder pain [M25.511] 01/24/2017  . PAD (peripheral artery disease) (HCC) [I73.9] 12/27/2016  . Welcome to Medicare preventive visit [Z00.00] 10/24/2016  . Polyneuropathy associated with underlying disease (HCC) [G63] 10/04/2016  . Nicotine dependence [F17.200] 10/04/2016  . 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 Decreased libido [R68.82] 10/04/2016  . Chronic midline low back pain [M54.5, G89.29] 10/04/2016    Past Psychiatric History: MDD, polysubstance abuse  Past Medical History:  Past Medical History:  Diagnosis Date  . Chronic midline low back pain 10/04/2016  . Decreased libido 10/04/2016  . Depression   . Depression, major, single episode, moderate (HCC) 10/04/2016  . 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 Polyneuropathy associated with underlying disease (HCC) 10/04/2016  . Smoker 10/04/2016    Past Surgical History:  Procedure Laterality Date  . HERNIA REPAIR     Family History:  Family History  Problem Relation Age of Onset  . Diabetes Brother   . Cancer Brother   . Heart disease  Father    Family Psychiatric  History: Denies Social History:  History  Alcohol Use No     History  Drug Use No    Social History   Social History  . Marital status: Single    Spouse name: N/A  . Number of children: N/A  . Years of education: N/A   Occupational History  . Disabled    Social History Main Topics  . Smoking status: Current Every Day Smoker    Packs/day: 1.00    Years: 20.00    Types: Cigarettes  . Smokeless tobacco: Never Used  . Alcohol use No  . Drug use: No  . Sexual activity: Not Asked   Other Topics Concern  . None   Social History Narrative  . None    Hospital Course:   01/30/17: Admission nurse note: 51 year old male being admitted voluntarily to [facility] from [facility]. He came to the ED with passive suicidal ideation and substance abuse. He reported using meth and cocaine. 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 He has medical diagnosis of diabetes. He was very fidgety during admission. He was unable to tell RN how much alcohol he drinks, he stated only twice in the last two days and had been clean for 3 months the stated "just put me down for 1/5 of liquor daily." He reported passive SI and is able to contract for safety on the unit. He denies HI or A/V hallucinations. Patient was started on Gabapentin 1200 mg PO QHS, Mirtazapine 15 mg PO QHS, Trazodone 50 mg PO QHS PRN for insomnia, and Vistaril 25 mg PO TID PRN for anxiety  and stabilized on these medications. He was restarted on his Metformin as well and he had diarrhea right after starting it, so medication was decreased and then back to home prescription of 1000 mg PO BID. Patient continued to deny SI/HI/AVH and contracted for safety on the unit. Patient wanted to go to a residential program but was not accepted into one. He agreed to discharge home and follow up at his outpatient provider. He also  agreed to continue his medications as prescribed. He was provided with prescriptions for his medications.  Patient attended groups and was seen interacting appropriately with others.   Physical Findings: AIMS: Facial and Oral Movements Muscles of Facial Expression: None, normal Lips and Perioral Area: None, normal Jaw: None, normal Tongue: None, normal,Extremity Movements Upper (arms, wrists, hands, fingers): None, normal Lower (legs, knees, ankles, toes): None, normal, Trunk Movements Neck, shoulders, hips: None, normal, Overall Severity Severity of abnormal movements (highest score from questions above): None, normal Incapacitation due to abnormal movements: None, normal Patient's awareness of abnormal movements (rate only patient's report): No Awareness, Dental Status Current problems with teeth and/or dentures?: No Does patient usually wear dentures?: No  CIWA:  CIWA-Ar Total: 3 COWS:     Musculoskeletal: Strength & Muscle Tone: within normal limits Gait & Station: normal Patient leans: N/A  Psychiatric Specialty Exam: Physical Exam  Nursing note and vitals reviewed. Constitutional: He is oriented to person, place, and time. He appears well-developed and well-nourished.  Cardiovascular: Normal rate.   Respiratory: Effort normal.  Musculoskeletal: Normal range of motion.  Neurological: He is alert and oriented to person, place, and time.  Skin: Skin is warm.    Review of Systems  Constitutional: Negative.   HENT: Negative.   Eyes: Negative.   Respiratory: Negative.   Cardiovascular: Negative.   Gastrointestinal: Negative.   Genitourinary: Negative.   Musculoskeletal: Negative.   Skin: Negative.   Neurological: Negative.   Endo/Heme/Allergies: Negative.     Blood pressure 124/82, pulse 94, temperature 100.3 F (37.9 C), temperature source Oral, resp. rate 18, height 5\' 11"  (1.803 m), weight 87.1 kg (192 lb), SpO2 100 %.Body mass index is 26.78 kg/m.  General  Appearance: Casual and Disheveled  Eye Contact:  Good  Speech:  Clear and Coherent and Normal Rate  Volume:  Normal  Mood:  Euthymic  Affect:  Flat  Thought Process:  Coherent and Descriptions of Associations: Intact  Orientation:  Full (Time, Place, and Person)  Thought Content:  WDL  Suicidal Thoughts:  No  Homicidal Thoughts:  No  Memory:  Immediate;   Good Recent;   Good  Judgement:  Good  Insight:  Good  Psychomotor Activity:  Normal  Concentration:  Concentration: Good  Recall:  Good  Fund of Knowledge:  Good  Language:  Good  Akathisia:  Negative  Handed:  Right  AIMS (if indicated):     Assets:  Financial Resources/Insurance Social Support  ADL's:  Intact  Cognition:  WNL  Sleep:  Number of Hours: 6.75     Have you used any form of tobacco in the last 30 days? (Cigarettes, Smokeless Tobacco, Cigars, and/or Pipes): Yes  Has this patient used any form of tobacco in the last 30 days? (Cigarettes, Smokeless Tobacco, Cigars, and/or Pipes) Yes, Yes, A prescription for an FDA-approved tobacco cessation medication was offered at discharge and the patient refused  Blood Alcohol level:  Lab Results  Component Value Date   [facility] <5 01/29/2017    Metabolic Disorder Labs:  Lab  Results  Component Value Date   HGBA1C 6.9 01/24/2017   MPG 280 04/19/2016   No results found for: PROLACTIN Lab Results  Component Value Date   CHOL 167 10/04/2016   TRIG 188.0 (H) 10/04/2016   HDL 39.70 10/04/2016   CHOLHDL 4 10/04/2016   VLDL 37.6 10/04/2016   LDLCALC 89 10/04/2016    See Psychiatric Specialty Exam and Suicide Risk Assessment completed by Attending Physician prior to discharge.  Discharge destination:  Home  Is patient on multiple antipsychotic therapies at discharge:  No   Has Patient had three or more failed trials of antipsychotic monotherapy by history:  No  Recommended Plan for Multiple Antipsychotic Therapies: NA   Allergies as of 02/03/2017   No Known  Allergies     Medication List    STOP taking these medications   amitriptyline 50 MG tablet Commonly known as:  ELAVIL   HYDROcodone-acetaminophen 5-325 MG tablet Commonly known as:  NORCO/VICODIN   pregabalin 150 MG capsule Commonly known as:  LYRICA     TAKE these medications     Indication  gabapentin 400 MG capsule Commonly known as:  NEURONTIN Take 3 capsules (1,200 mg total) by mouth at bedtime.  Indication:  Aggressive Behavior, Agitation, Trouble Sleeping   hydrOXYzine 25 MG tablet Commonly known as:  ATARAX/VISTARIL Take 1 tablet (25 mg total) by mouth 3 (three) times daily as needed for anxiety.  Indication:  Feeling Anxious   metFORMIN 1000 MG tablet Commonly known as:  GLUCOPHAGE Take 1 tablet (1,000 mg total) by mouth 2 (two) times daily with a meal. What changed:  Another medication with the same name was added. Make sure you understand how and when to take each.  Indication:  Type 2 Diabetes   metFORMIN 1000 MG tablet Commonly known as:  GLUCOPHAGE Take 1 tablet (1,000 mg total) by mouth 2 (two) times daily with a meal. For Diabetes What changed:  You were already taking a medication with the same name, and this prescription was added. Make sure you understand how and when to take each.  Indication:  Type 2 Diabetes   mirtazapine 15 MG tablet Commonly known as:  REMERON Take 1 tablet (15 mg total) by mouth at bedtime. For mood control  Indication:  Major Depressive Disorder   rosuvastatin 10 MG tablet Commonly known as:  CRESTOR Take 1 tablet (10 mg total) by mouth daily. What changed:  Another medication with the same name was added. Make sure you understand how and when to take each.  Indication:  High Amount of Fats in the Blood   rosuvastatin 10 MG tablet Commonly known as:  CRESTOR Take 1 tablet (10 mg total) by mouth daily at 6 PM. For high cholesterol What changed:  You were already taking a medication with the same name, and this  prescription was added. Make sure you understand how and when to take each.  Indication:  High Amount of Fats in the Blood   traZODone 50 MG tablet Commonly known as:  DESYREL Take 1 tablet (50 mg total) by mouth at bedtime as needed for sleep. What changed:  See the new instructions.  Indication:  Trouble Sleeping      Follow-up Information    [facility] Follow up.   Specialty:  Behavioral Health Why:  You are not eligible for [facility] (do not meet inpatient criteria) or [facility] (medicaid out of county). Please follow-up with [facility] for medication management and outpatient services. Thank you.  Contact information:  [address] [place] [address] [phone]           Follow-up recommendations:  Continue activity as tolerated. Continue diet as recommended by your PCP. Ensure to keep all appointments with outpatient providers.  Comments:  Patient is instructed prior to discharge to: Take all medications as prescribed by his/her mental healthcare provider. Report any adverse effects and or reactions from the medicines to his/her outpatient provider promptly. Patient has been instructed & cautioned: To not engage in alcohol and or illegal drug use while on prescription medicines. In the event of worsening symptoms, patient is instructed to call the crisis hotline, 911 and or go to the nearest ED for appropriate evaluation and treatment of symptoms. To follow-up with his/her primary care provider for your other medical issues, concerns and or health care needs.    Signed: Gerlene Burdockravis B Ethelbert Thain, FNP 02/03/2017, 11:00 AM

## 2017-02-03 NOTE — Progress Notes (Signed)
Pt has [facility] screening at 9:00AM. Bed likely available for him on Saturday if he is approved for admission.  Trula SladeHeather Smart, MSW, LCSW Clinical Social Worker 02/03/2017 8:21 AM

## 2017-02-03 NOTE — Progress Notes (Addendum)
  [facility] :  Will you be returning to the same living situation after discharge:  Yes,  [facility] At discharge, do you have transportation home?: Yes,  bus pass Do you have the ability to pay for your medications: Yes,  medicare  Release of information consent forms completed and submitted to medical records by CSW.  Patient to Follow up at: Follow-up Information    [facility] Follow up on 02/07/2017.   Specialty:  Behavioral Health Why:  Hospital follow-up on Tuesdasy at 8:00AM. Please bring photo ID and insurance card. Thank you.  Contact information: [address] [place] [address] [phone]           Next level of care provider [facility] Link:no  Safety Planning and Suicide Prevention discussed: Yes,  SPE completed with pt; contact attempts made with pt's friend. SPI pamphlet and Mobile Crisis information provided to pt.   Have you used any form of tobacco in the last 30 days? (Cigarettes, Smokeless Tobacco, Cigars, and/or Pipes): Yes  Has patient been referred to the Quitline?: Patient refused referral  Patient has been referred for addiction treatment: Yes  Pulte HomesHeather N Smart, LCSW 02/03/2017, 11:50 AM

## 2017-02-03 NOTE — Progress Notes (Signed)
[facility]does not meet inpatient criteria due to length of relapse being "one week." [facility] not eligible [facility] [place] and also due to not meeting inpatient criteria. [facility] and requested bus pass.   Trula SladeHeather Smart, MSW, LCSW Clinical Social Worker 02/03/2017 10:07 AM

## 2017-02-03 NOTE — [facility] Suicide Risk Assessment (Signed)
[facility] INPATIENT:  Family/Significant Other Suicide Prevention Education  Suicide Prevention Education:  Contact Attempts: Dorinda HillDonald (pt's friend) [phone] has been identified by the patient as the family member/significant other with whom the patient will be residing, and identified as the person(s) who will aid the patient in the event of a mental health crisis.  With written consent from the patient, two attempts were made to provide suicide prevention education, prior to and/or following the patient's discharge.  We were unsuccessful in providing suicide prevention education.  A suicide education pamphlet was given to the patient to share with family/significant other.  Date and time of first attempt: 02/02/17 at 4:07PM (no answer/unable to leave voicemail).  Date and time of second attempt: 02/03/17 at 10:17AM (no answer/unable to leave voicemail).   Jerry Shepherd N Smart LCSW 02/03/2017, 10:08 AM

## 2017-02-07 ENCOUNTER — Other Ambulatory Visit: Payer: Self-pay | Admitting: Family Medicine

## 2017-02-07 NOTE — Telephone Encounter (Signed)
Patient called to check the status of the notes below. Patient is out of the medication.

## 2017-02-07 NOTE — Telephone Encounter (Signed)
MEDICATION: hydrOXYzine (ATARAX/VISTARIL) 25 MG tablet  PHARMACY:  [place] in [place] today and would like it sent to [facility] there. [facility] [address], [place], [place] [address] [phone]     IS THIS A 90 DAY SUPPLY : N/A  IS PATIENT OUT OF MEDICATION: Y  IF NOT; HOW MUCH IS LEFT:   LAST APPOINTMENT DATE: 01/24/17  NEXT APPOINTMENT DATE: 04/26/17  OTHER COMMENTS: Patient stated he is out of this medication and stated that Dr. Earlene PlaterWallace was waiting to see when he saw Neuro before refilling. Please call patient with any questions. OK to leave message.    **Let patient know to contact pharmacy at the end of the day to make sure medication is ready. **  ** Please notify patient to allow 48-72 hours to process**  **Encourage patient to contact the pharmacy for refills or they can request refills through [facility]**

## 2017-02-07 NOTE — Telephone Encounter (Signed)
Patient called in to check on status of Rx below. Patient would really like this to be called in today. Please call and advise.

## 2017-02-08 NOTE — Telephone Encounter (Signed)
Call patient to update at [phone] today before 5pm.

## 2017-02-08 NOTE — Telephone Encounter (Signed)
Please advise on refill.

## 2017-02-08 NOTE — Telephone Encounter (Signed)
[facility].

## 2017-02-08 NOTE — Telephone Encounter (Signed)
MEDICATION: Vicodin (hyrdrocodone) for pain  PHARMACY: [facility] - [place], [place] - [address] [place] [address] Phone: [phone] Fax: [phone]    IS THIS A 90 DAY SUPPLY : no  IS PATIENT OUT OF MEDICATION: yes  IF NOT; HOW MUCH IS LEFT:   LAST APPOINTMENT DATE: 08/21  NEXT APPOINTMENT DATE: 11/21  OTHER COMMENTS: Patient says he did not ask for a refill on Hydroxyzine 25mg  tab, he wants a refill on Vicodin(hydrocodone). Patient says Dr. Earlene PlaterWallace prescribed Vicodin to him before but I don't see it in his chart.    **Let patient know to contact pharmacy at the end of the day to make sure medication is ready. **  ** Please notify patient to allow 48-72 hours to process**  **Encourage patient to contact the pharmacy for refills or they can [facility]**

## 2017-02-08 NOTE — Telephone Encounter (Signed)
Pt is aware that he will not be getting anymore medication and is agreeable.

## 2017-04-26 ENCOUNTER — Ambulatory Visit: Payer: Medicare Other | Admitting: Family Medicine

## 2017-05-01 ENCOUNTER — Ambulatory Visit: Payer: Medicare Other | Admitting: Neurology

## 2017-05-01 ENCOUNTER — Ambulatory Visit: Payer: Medicare Other | Admitting: Podiatry

## 2017-05-17 IMAGING — DX DG LUMBAR SPINE 2-3V
3 series · 3 of 3 positions shown · non-contrast
Comparison: None.

CLINICAL DATA: Chronic low back pain, no known injury, initial
encounter

EXAM:
LUMBAR SPINE - 3 VIEW

[lumbar spine ap]
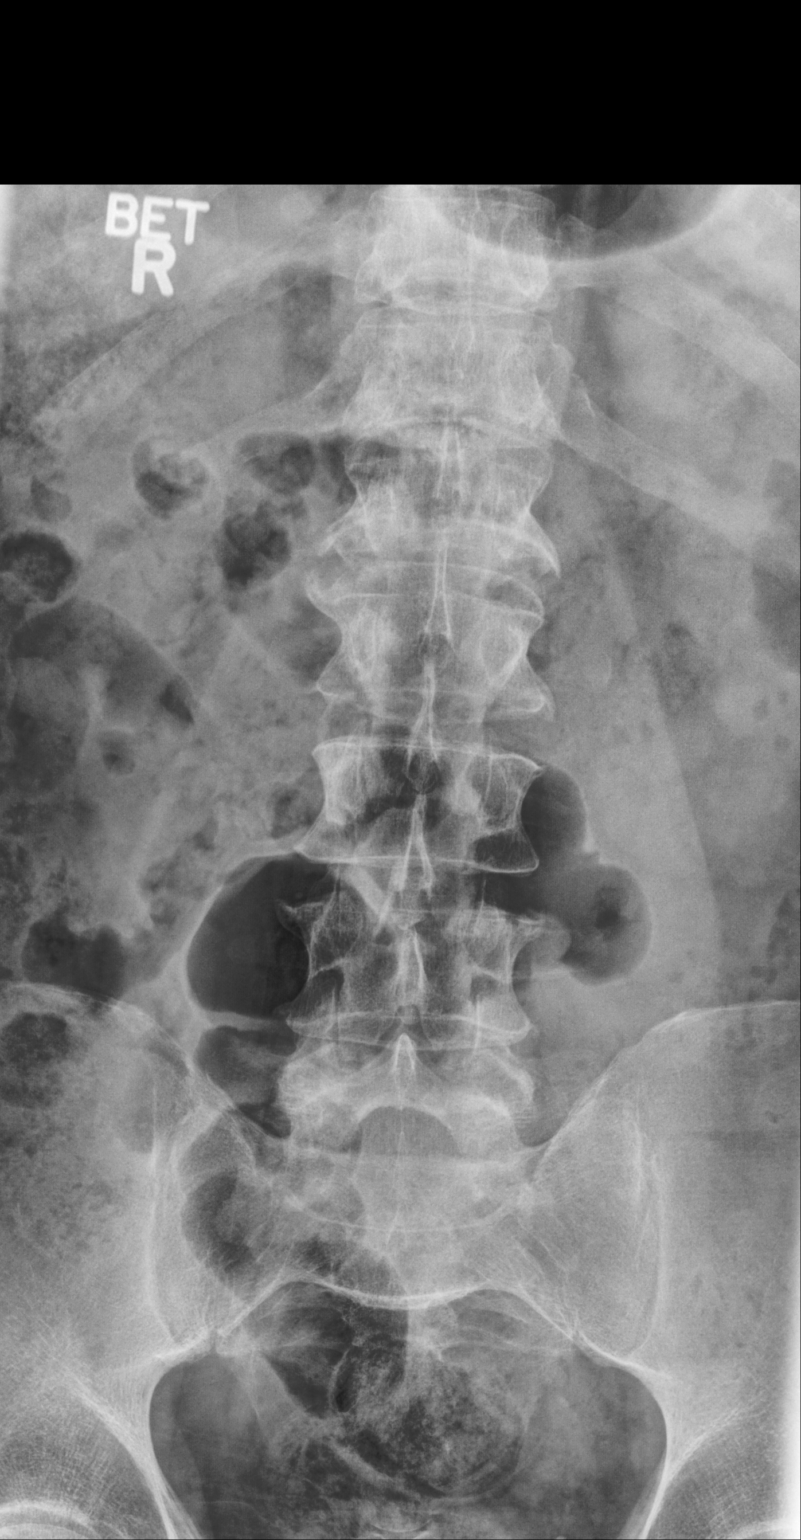

[lumbar spine lat (1 of 2)]
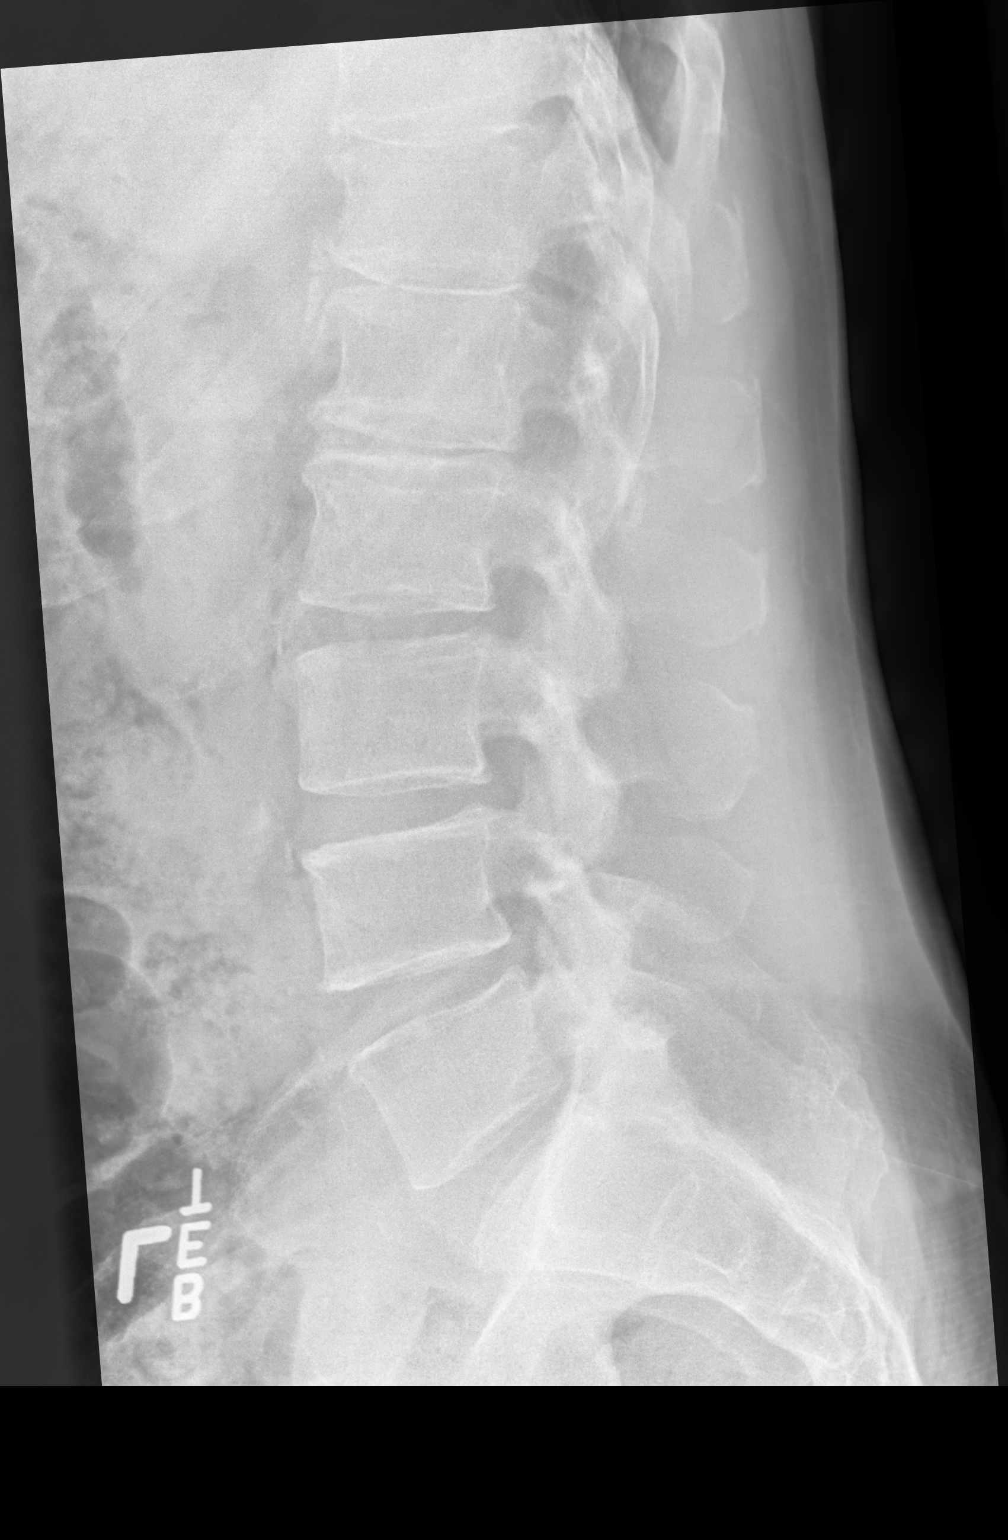

[lumbar spine lat (2 of 2)]
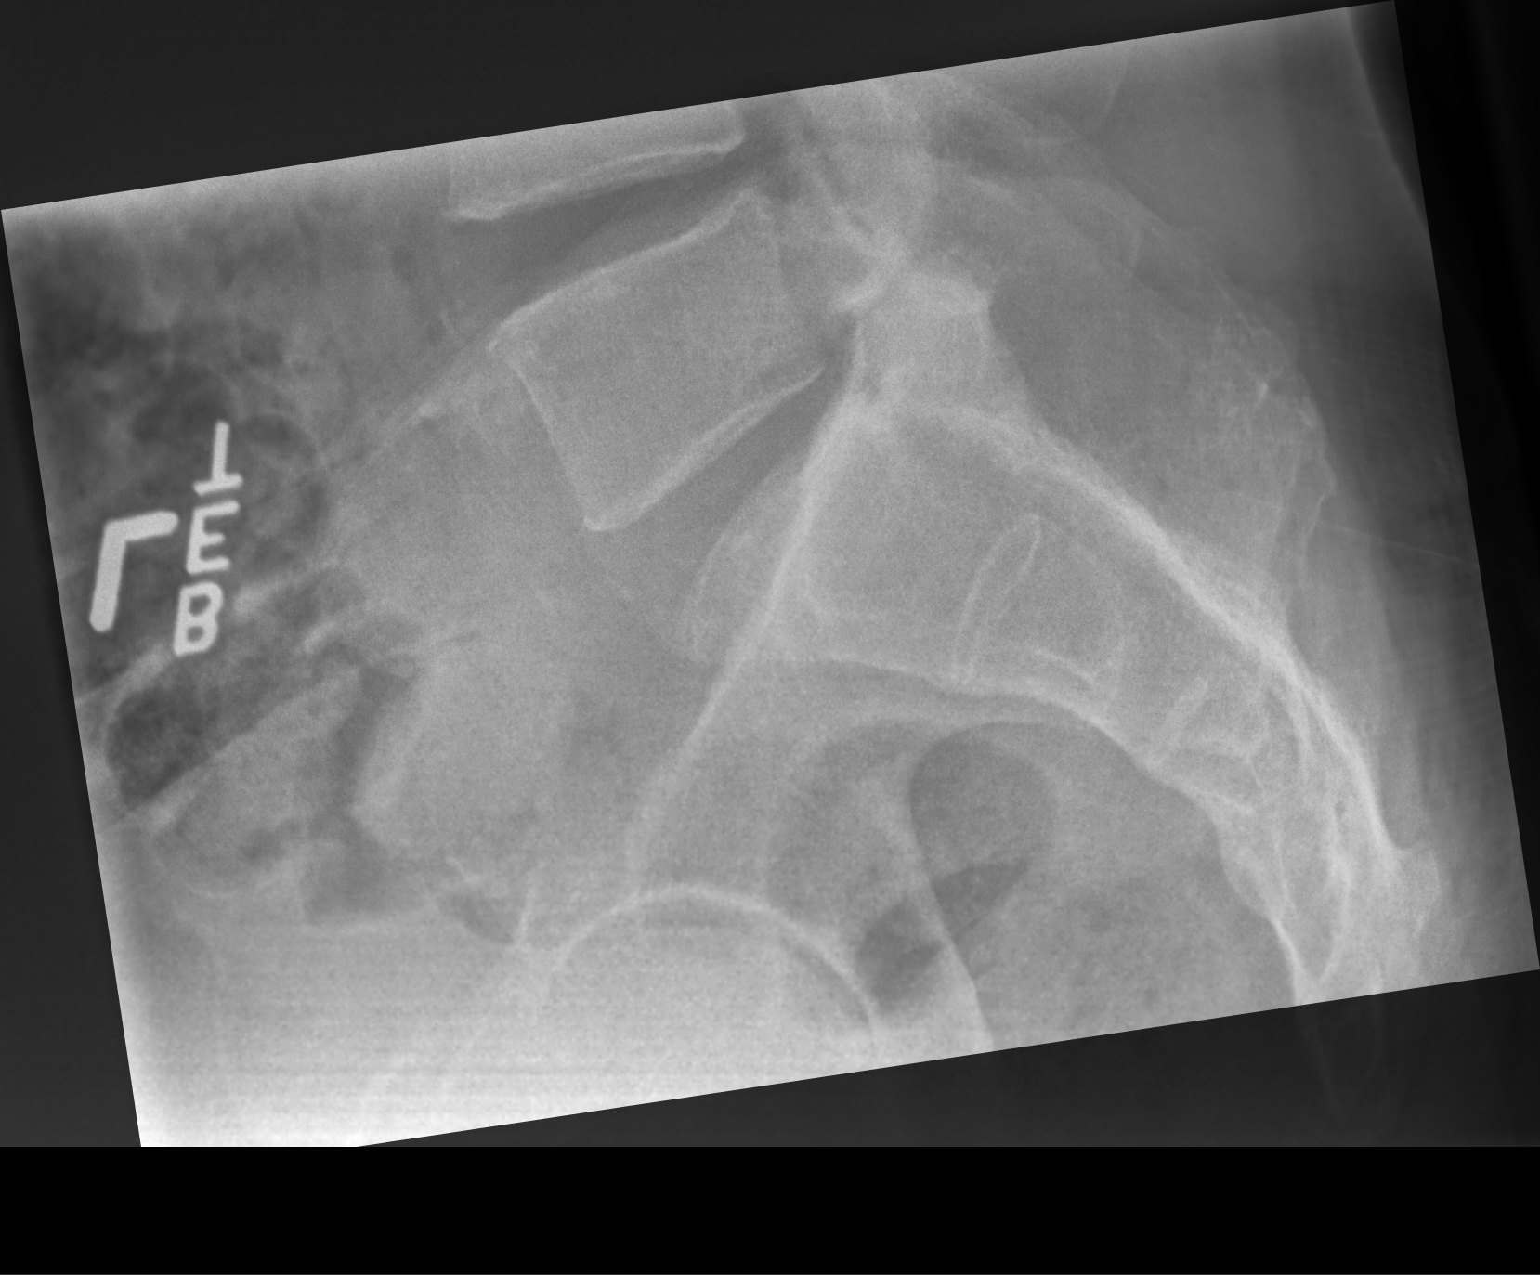

[3 of 3 positions shown; findings below may reference images not displayed]

FINDINGS: Five lumbar type vertebral bodies are well visualized. Vertebral
body height is well maintained. Mild osteophytic changes are seen
particularly at L1-2. No soft tissue abnormality is seen.
IMPRESSION: Mild degenerative change without acute abnormality.

## 2017-09-08 DIAGNOSIS — I10 Essential (primary) hypertension: Secondary | ICD-10-CM | POA: Diagnosis not present

## 2017-09-19 DIAGNOSIS — F332 Major depressive disorder, recurrent severe without psychotic features: Secondary | ICD-10-CM | POA: Diagnosis not present

## 2017-09-21 DIAGNOSIS — E119 Type 2 diabetes mellitus without complications: Secondary | ICD-10-CM | POA: Diagnosis not present

## 2017-09-22 DIAGNOSIS — E1140 Type 2 diabetes mellitus with diabetic neuropathy, unspecified: Secondary | ICD-10-CM | POA: Diagnosis not present

## 2017-09-26 DIAGNOSIS — E119 Type 2 diabetes mellitus without complications: Secondary | ICD-10-CM | POA: Diagnosis not present

## 2017-09-26 DIAGNOSIS — R197 Diarrhea, unspecified: Secondary | ICD-10-CM | POA: Diagnosis not present

## 2017-11-26 ENCOUNTER — Emergency Department (HOSPITAL_COMMUNITY): Payer: Medicare Other

## 2017-11-26 ENCOUNTER — Encounter (HOSPITAL_COMMUNITY): Payer: Self-pay | Admitting: Emergency Medicine

## 2017-11-26 ENCOUNTER — Emergency Department (HOSPITAL_COMMUNITY)
Admission: EM | Admit: 2017-11-26 | Discharge: 2017-11-27 | Disposition: A | Discharge status: Home / Self Care | Payer: Medicare Other | Attending: Emergency Medicine | Admitting: Emergency Medicine

## 2017-11-26 ENCOUNTER — Other Ambulatory Visit: Payer: Self-pay

## 2017-11-26 DIAGNOSIS — L089 Local infection of the skin and subcutaneous tissue, unspecified: Secondary | ICD-10-CM

## 2017-11-26 DIAGNOSIS — E1140 Type 2 diabetes mellitus with diabetic neuropathy, unspecified: Secondary | ICD-10-CM | POA: Diagnosis not present

## 2017-11-26 DIAGNOSIS — L03032 Cellulitis of left toe: Secondary | ICD-10-CM | POA: Insufficient documentation

## 2017-11-26 DIAGNOSIS — F17210 Nicotine dependence, cigarettes, uncomplicated: Secondary | ICD-10-CM | POA: Diagnosis not present

## 2017-11-26 DIAGNOSIS — Z79899 Other long term (current) drug therapy: Secondary | ICD-10-CM | POA: Insufficient documentation

## 2017-11-26 DIAGNOSIS — M79675 Pain in left toe(s): Secondary | ICD-10-CM | POA: Diagnosis present

## 2017-11-26 DIAGNOSIS — I889 Nonspecific lymphadenitis, unspecified: Secondary | ICD-10-CM

## 2017-11-26 DIAGNOSIS — M7989 Other specified soft tissue disorders: Secondary | ICD-10-CM | POA: Diagnosis not present

## 2017-11-26 DIAGNOSIS — Z7984 Long term (current) use of oral hypoglycemic drugs: Secondary | ICD-10-CM | POA: Insufficient documentation

## 2017-11-26 LAB — CBC WITH DIFFERENTIAL/PLATELET
Basophils Absolute: 0 10*3/uL (ref 0.0–0.1)
Basophils Relative: 0 %
EOS ABS: 0.1 10*3/uL (ref 0.0–0.7)
Eosinophils Relative: 1 %
HCT: 44.4 % (ref 39.0–52.0)
HEMOGLOBIN: 15.7 g/dL (ref 13.0–17.0)
LYMPHS ABS: 3.1 10*3/uL (ref 0.7–4.0)
Lymphocytes Relative: 33 %
MCH: 31.5 pg (ref 26.0–34.0)
MCHC: 35.4 g/dL (ref 30.0–36.0)
MCV: 89.2 fL (ref 78.0–100.0)
MONO ABS: 0.6 10*3/uL (ref 0.1–1.0)
MONOS PCT: 6 %
NEUTROS PCT: 60 %
Neutro Abs: 5.4 10*3/uL (ref 1.7–7.7)
Platelets: 166 10*3/uL (ref 150–400)
RBC: 4.98 MIL/uL (ref 4.22–5.81)
RDW: 13.7 % (ref 11.5–15.5)
WBC: 9.1 10*3/uL (ref 4.0–10.5)

## 2017-11-26 LAB — CBG MONITORING, ED
Glucose-Capillary: 320 mg/dL — ABNORMAL HIGH (ref 65–99)
Glucose-Capillary: 400 mg/dL — ABNORMAL HIGH (ref 65–99)

## 2017-11-26 MED ORDER — KETOROLAC TROMETHAMINE 30 MG/ML IJ SOLN
15.0000 mg | Freq: Once | INTRAMUSCULAR | Status: AC
  Administered 2017-11-26: 15 mg via INTRAVENOUS
  Filled 2017-11-26: qty 1

## 2017-11-26 MED ORDER — CLINDAMYCIN PHOSPHATE 600 MG/50ML IV SOLN
600.0000 mg | Freq: Once | INTRAVENOUS | Status: AC
  Administered 2017-11-26: 600 mg via INTRAVENOUS
  Filled 2017-11-26: qty 50

## 2017-11-26 NOTE — ED Triage Notes (Signed)
Pt c/o pain and swelling to toe on the left foot x 4 days.

## 2017-11-27 DIAGNOSIS — L089 Local infection of the skin and subcutaneous tissue, unspecified: Secondary | ICD-10-CM | POA: Diagnosis not present

## 2017-11-27 LAB — C-REACTIVE PROTEIN: CRP: 1.2 mg/dL — ABNORMAL HIGH (ref <1.0)

## 2017-11-27 LAB — COMPREHENSIVE METABOLIC PANEL
ALBUMIN: 4.1 g/dL (ref 3.5–5.0)
ALK PHOS: 65 U/L (ref 38–126)
ALT: 17 U/L (ref 17–63)
AST: 13 U/L — AB (ref 15–41)
Anion gap: 9 (ref 5–15)
BILIRUBIN TOTAL: 1.3 mg/dL — AB (ref 0.3–1.2)
BUN: 14 mg/dL (ref 6–20)
CALCIUM: 9 mg/dL (ref 8.9–10.3)
CO2: 24 mmol/L (ref 22–32)
Chloride: 99 mmol/L — ABNORMAL LOW (ref 101–111)
Creatinine, Ser: 0.84 mg/dL (ref 0.61–1.24)
GFR calc Af Amer: >60 mL/min (ref >60)
GFR calc non Af Amer: >60 mL/min (ref >60)
GLUCOSE: 376 mg/dL — AB (ref 65–99)
Potassium: 3.4 mmol/L — ABNORMAL LOW (ref 3.5–5.1)
SODIUM: 132 mmol/L — AB (ref 135–145)
TOTAL PROTEIN: 7.1 g/dL (ref 6.5–8.1)

## 2017-11-27 LAB — SEDIMENTATION RATE: Sed Rate: 9 mm/hr (ref 0–16)

## 2017-11-27 MED ORDER — TRAMADOL HCL 50 MG PO TABS: 100.0000 mg | ORAL_TABLET | Freq: Four times a day (QID) | ORAL | 0 refills | Status: DC | PRN

## 2017-11-27 MED ORDER — PIPERACILLIN-TAZOBACTAM 3.375 G IVPB 30 MIN
3.3750 g | Freq: Once | INTRAVENOUS | Status: AC
  Administered 2017-11-27: 3.375 g via INTRAVENOUS
  Filled 2017-11-27: qty 50

## 2017-11-27 MED ORDER — ACETAMINOPHEN 325 MG PO TABS
650.0000 mg | ORAL_TABLET | Freq: Once | ORAL | Status: AC
  Administered 2017-11-27: 650 mg via ORAL
  Filled 2017-11-27: qty 2

## 2017-11-27 MED ORDER — METFORMIN HCL 1000 MG PO TABS: 1000.0000 mg | ORAL_TABLET | Freq: Two times a day (BID) | ORAL | 0 refills | Status: DC

## 2017-11-27 MED ORDER — CLINDAMYCIN HCL 150 MG PO CAPS: ORAL_CAPSULE | ORAL | 0 refills | Status: DC

## 2017-11-27 MED ORDER — INSULIN ASPART 100 UNIT/ML ~~LOC~~ SOLN
5.0000 [IU] | Freq: Once | SUBCUTANEOUS | Status: AC
  Administered 2017-11-27: 5 [IU] via INTRAVENOUS
  Filled 2017-11-27: qty 1

## 2017-11-27 MED ORDER — TRAMADOL HCL 50 MG PO TABS
100.0000 mg | ORAL_TABLET | Freq: Once | ORAL | Status: AC
  Administered 2017-11-27: 100 mg via ORAL
  Filled 2017-11-27: qty 2

## 2017-11-27 NOTE — ED Provider Notes (Signed)
[facility] EMERGENCY DEPARTMENT Provider Note   CSN: [id number] Arrival date & time: 11/26/17  2215  Time seen 23:25 PM    History   Chief Complaint Chief Complaint  Patient presents with  . Foot Pain    HPI Jerry Shepherd is a 52 y.o. male.  HPI patient reports he has a history of diabetes however he has run out of his medications at least a month ago.  He does not have a monitor to monitor his blood sugars.  He states 4 days ago he started having pain in his left fourth toe.  He denies any known injury.  He states he is never had problems with that toe before.  He states 2 days ago he started noticing swelling of the toe.  He then started noticing a blister on the toe.  He denies fever, nausea, or vomiting.  Patient states he has neuropathy and he normally wears open toed sandals which he is wearing tonight.  His girlfriend states that she thought she saw a small cut on the end of his toe near the nail bed on the radial aspect.  PCP Patient, No Pcp Per   Past Medical History:  Diagnosis Date  . Chronic midline low back pain 10/04/2016  . Decreased libido 10/04/2016  . Depression   . Depression, major, single episode, moderate (HCC) 10/04/2016  . 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 Polyneuropathy associated with underlying disease (HCC) 10/04/2016  . Smoker 10/04/2016    Patient Active Problem List   Diagnosis Date Noted  . 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 Substance induced mood disorder (HCC) 01/30/2017  . Major depressive disorder, recurrent severe without psychotic features (HCC) 01/30/2017  . HLD (hyperlipidemia) 01/24/2017  . Right shoulder pain 01/24/2017  . PAD (peripheral artery disease) (HCC) 12/27/2016  . Welcome to Medicare preventive visit 10/24/2016  . Polyneuropathy associated with underlying disease (HCC) 10/04/2016  . Nicotine dependence 10/04/2016  . DM (diabetes mellitus), type 2 with neurological complications  (HCC) 10/04/2016  . Decreased libido 10/04/2016  . Chronic midline low back pain 10/04/2016    Past Surgical History:  Procedure Laterality Date  . HERNIA REPAIR          Home Medications    Prior to Admission medications   Medication Sig Start Date End Date Taking? Authorizing Provider  clindamycin (CLEOCIN) 150 MG capsule Take 2 po QID 11/27/17   Devoria Albe, MD  gabapentin (NEURONTIN) 400 MG capsule Take 3 capsules (1,200 mg total) by mouth at bedtime. 02/03/17   Money, Gerlene Burdock, FNP  hydrOXYzine (ATARAX/VISTARIL) 25 MG tablet Take 1 tablet (25 mg total) by mouth 3 (three) times daily as needed for anxiety. 02/03/17   Money, Gerlene Burdock, FNP  metFORMIN (GLUCOPHAGE) 1000 MG tablet Take 1 tablet (1,000 mg total) by mouth 2 (two) times daily with a meal. For Diabetes 11/27/17   Devoria Albe, MD  mirtazapine (REMERON) 15 MG tablet Take 1 tablet (15 mg total) by mouth at bedtime. For mood control 02/03/17   Money, Feliz Beam B, FNP  rosuvastatin (CRESTOR) 10 MG tablet Take 1 tablet (10 mg total) by mouth daily. 11/22/16   Helane Rima, DO  rosuvastatin (CRESTOR) 10 MG tablet Take 1 tablet (10 mg total) by mouth daily at 6 PM. For high cholesterol 02/03/17   Money, Gerlene Burdock, FNP  traMADol (ULTRAM) 50 MG tablet Take 2 tablets (100 mg total) by mouth every 6 (six) hours as needed. 11/27/17  Devoria Albe, MD  traZODone (DESYREL) 50 MG tablet Take 1 tablet (50 mg total) by mouth at bedtime as needed for sleep. 02/03/17   Money, Gerlene Burdock, FNP    Family History Family History  Problem Relation Age of Onset  . Diabetes Brother   . Cancer Brother   . Heart disease Father     Social History Social History   Tobacco Use  . Smoking status: Current Every Day Smoker    Packs/day: 1.00    Years: 20.00    Pack years: 20.00    Types: Cigarettes  . Smokeless tobacco: Never Used  Substance Use Topics  . Alcohol use: No  . Drug use: No  states he recently moved to [place] from [place] [place] [place] 1 1/2 ppd On  disability for diabetes   Allergies   Patient has no known allergies.   Review of Systems Review of Systems  All other systems reviewed and are negative.    Physical Exam Updated Vital Signs BP (!) 128/104 (BP Location: Right Arm)   Pulse (!) 124   Temp 98 F (36.7 C) (Oral)   Resp 15   SpO2 100%   Vital signs normal    Physical Exam  Constitutional: He is oriented to person, place, and time. He appears well-developed and well-nourished.  HENT:  Head: Normocephalic and atraumatic.  Right Ear: External ear normal.  Left Ear: External ear normal.  Nose: Nose normal.  Eyes: Conjunctivae and EOM are normal.  Neck: Normal range of motion.  Cardiovascular: Normal rate.  Pulmonary/Chest: Effort normal. No respiratory distress.  Musculoskeletal: He exhibits edema and tenderness.  Patient is noted to have a fluid-filled blister on the lateral aspect of his fourth left toe.  There is diffuse redness and swelling of that toe with redness extending over the dorsum of the foot almost to the ankle.  Neurological: He is alert and oriented to person, place, and time. No cranial nerve deficit.  Skin: Skin is warm and dry. There is erythema.  Psychiatric: He has a normal mood and affect. His behavior is normal. Thought content normal.  Nursing note and vitals reviewed.        ED Treatments / Results  Labs (all labs ordered are listed, but only abnormal results are displayed) Results for orders placed or performed during the hospital encounter of 11/26/17  Comprehensive metabolic panel  Result Value Ref Range   Sodium 132 (L) 135 - 145 mmol/L   Potassium 3.4 (L) 3.5 - 5.1 mmol/L   Chloride 99 (L) 101 - 111 mmol/L   CO2 24 22 - 32 mmol/L   Glucose, Bld 376 (H) 65 - 99 mg/dL   BUN 14 6 - 20 mg/dL   Creatinine, Ser 3.24 0.61 - 1.24 mg/dL   Calcium 9.0 8.9 - 40.1 mg/dL   Total Protein 7.1 6.5 - 8.1 g/dL   Albumin 4.1 3.5 - 5.0 g/dL   AST 13 (L) 15 - 41 U/L   ALT 17 17 - 63  U/L   Alkaline Phosphatase 65 38 - 126 U/L   Total Bilirubin 1.3 (H) 0.3 - 1.2 mg/dL   GFR calc non Af Amer >60 >60 mL/min   GFR calc Af Amer >60 >60 mL/min   Anion gap 9 5 - 15  CBC with Differential  Result Value Ref Range   WBC 9.1 4.0 - 10.5 K/uL   RBC 4.98 4.22 - 5.81 MIL/uL   Hemoglobin 15.7 13.0 - 17.0 g/dL   HCT 44.4  39.0 - 52.0 %   MCV 89.2 78.0 - 100.0 fL   MCH 31.5 26.0 - 34.0 pg   MCHC 35.4 30.0 - 36.0 g/dL   RDW 16.1 09.6 - 04.5 %   Platelets 166 150 - 400 K/uL   Neutrophils Relative % 60 %   Neutro Abs 5.4 1.7 - 7.7 K/uL   Lymphocytes Relative 33 %   Lymphs Abs 3.1 0.7 - 4.0 K/uL   Monocytes Relative 6 %   Monocytes Absolute 0.6 0.1 - 1.0 K/uL   Eosinophils Relative 1 %   Eosinophils Absolute 0.1 0.0 - 0.7 K/uL   Basophils Relative 0 %   Basophils Absolute 0.0 0.0 - 0.1 K/uL  Sedimentation rate  Result Value Ref Range   Sed Rate 9 0 - 16 mm/hr  POC CBG, ED  Result Value Ref Range   Glucose-Capillary 320 (H) 65 - 99 mg/dL  CBG monitoring, ED  Result Value Ref Range   Glucose-Capillary 400 (H) 65 - 99 mg/dL   Laboratory interpretation all normal except hyperglycemia without metabolic acidosis, normal sed rate reassuring for no evidence of osteomyelitis.   EKG None  Radiology Dg Toe 4th Left  Result Date: 11/26/2017 CLINICAL DATA:  52 year old male with left toe swelling x4 days. EXAM: LEFT FOURTH TOE COMPARISON:  None. FINDINGS: There is no acute fracture or dislocation of the visualized bones. There is soft tissue swelling of the fourth digit which may represent cellulitis. Clinical correlation is recommended. No bone erosion or periosteal reaction. No soft tissue gas. IMPRESSION: 1. Soft tissue swelling of the fourth digit, likely cellulitis. Clinical correlation is recommended. 2. No acute fracture or dislocation. No radiographic findings to suggest acute osteomyelitis. Electronically Signed   By: Elgie Collard M.D.   On: 11/26/2017 23:46     Procedures Procedures (including critical care time)  Medications Ordered in ED Medications  ketorolac (TORADOL) 30 MG/ML injection 15 mg (15 mg Intravenous Given 11/26/17 2353)  clindamycin (CLEOCIN) IVPB 600 mg (0 mg Intravenous Stopped 11/27/17 0035)  piperacillin-tazobactam (ZOSYN) IVPB 3.375 g (0 g Intravenous Stopped 11/27/17 0117)  traMADol (ULTRAM) tablet 100 mg (100 mg Oral Given 11/27/17 0049)  acetaminophen (TYLENOL) tablet 650 mg (650 mg Oral Given 11/27/17 0048)  insulin aspart (novoLOG) injection 5 Units (5 Units Intravenous Given 11/27/17 0129)     Initial Impression / Assessment and Plan / ED Course  I have reviewed the triage vital signs and the nursing notes.  Pertinent labs & imaging results that were available during my care of the patient were reviewed by me and considered in my medical decision making (see chart for details).   Laboratory testing was done, patient was started on IV antibiotics.  Patient was given 1 dose of IV insulin to get his blood sugar down.  01:45 AM I went back to the talk to the patient, he wants to be discharged home.  He states he will be fine.  We discussed elevating his foot, using warm Epson salt soaks but not to burn his skin.  We also discussed if the red streak goes up above his ankle or if his symptoms are worsening he should return and be prepared to be admitted.  Patient was given some referrals to try to get a primary care doctor.  I did refill his metformin but I did not refill his psychiatric medications, [facility].  Final Clinical Impressions(s) / ED Diagnoses   Final diagnoses:  Infection of toe  Cellulitis of toe of left foot  Lymphadenitis    ED Discharge Orders        Ordered    clindamycin (CLEOCIN) 150 MG capsule     11/27/17 0156    traMADol (ULTRAM) 50 MG tablet  Every 6 hours PRN     11/27/17 0156    metFORMIN (GLUCOPHAGE) 1000 MG tablet  2 times daily with meals      11/27/17 0159      Plan discharge  Devoria AlbeIva Shaneal Barasch, MD, Concha PyoFACEP    Cameren Odwyer, MD 11/27/17 [id number]

## 2017-11-27 NOTE — Discharge Instructions (Signed)
Elevate your foot. Soak in warm epsom salts (do not burn your skin). Take the antibiotics until gone. Get back on metformin. Take tramadol 2 tabs with acetaminophen 650 mg 4 times a day for pain. If the redness or swelling goes above your ankle, you get a fever or seem worse, return to the ED to be admitted.

## 2018-01-25 ENCOUNTER — Other Ambulatory Visit: Payer: Self-pay

## 2018-01-25 ENCOUNTER — Emergency Department (HOSPITAL_COMMUNITY)
Admission: EM | Admit: 2018-01-25 | Discharge: 2018-01-25 | Disposition: A | Discharge status: Home / Self Care | Payer: Medicare Other | Attending: Emergency Medicine | Admitting: Emergency Medicine

## 2018-01-25 ENCOUNTER — Encounter (HOSPITAL_COMMUNITY): Payer: Self-pay | Admitting: Emergency Medicine

## 2018-01-25 DIAGNOSIS — F17210 Nicotine dependence, cigarettes, uncomplicated: Secondary | ICD-10-CM | POA: Diagnosis not present

## 2018-01-25 DIAGNOSIS — E1140 Type 2 diabetes mellitus with diabetic neuropathy, unspecified: Secondary | ICD-10-CM | POA: Diagnosis not present

## 2018-01-25 DIAGNOSIS — Z7984 Long term (current) use of oral hypoglycemic drugs: Secondary | ICD-10-CM | POA: Diagnosis not present

## 2018-01-25 DIAGNOSIS — H6002 Abscess of left external ear: Secondary | ICD-10-CM | POA: Diagnosis not present

## 2018-01-25 DIAGNOSIS — Z79899 Other long term (current) drug therapy: Secondary | ICD-10-CM | POA: Diagnosis not present

## 2018-01-25 DIAGNOSIS — L0291 Cutaneous abscess, unspecified: Secondary | ICD-10-CM | POA: Diagnosis present

## 2018-01-25 MED ORDER — SULFAMETHOXAZOLE-TRIMETHOPRIM 800-160 MG PO TABS
1.0000 | ORAL_TABLET | Freq: Once | ORAL | Status: AC
Start: 2018-01-25 — End: 2018-01-25
  Administered 2018-01-25: 1 via ORAL
  Filled 2018-01-25: qty 1

## 2018-01-25 MED ORDER — OXYCODONE-ACETAMINOPHEN 5-325 MG PO TABS
1.0000 | ORAL_TABLET | Freq: Once | ORAL | Status: AC
  Administered 2018-01-25: 1 via ORAL
  Filled 2018-01-25: qty 1

## 2018-01-25 MED ORDER — LIDOCAINE HCL (PF) 2 % IJ SOLN
INTRAMUSCULAR | Status: DC
Start: 2018-01-25 — End: 2018-01-25
  Filled 2018-01-25: qty 10

## 2018-01-25 MED ORDER — SULFAMETHOXAZOLE-TRIMETHOPRIM 800-160 MG PO TABS: 1.0000 | ORAL_TABLET | Freq: Two times a day (BID) | ORAL | 0 refills | Status: AC

## 2018-01-25 MED ORDER — LIDOCAINE HCL (PF) 2 % IJ SOLN
5.0000 mL | Freq: Once | INTRAMUSCULAR | Status: AC
  Administered 2018-01-25: 5 mL via INTRADERMAL

## 2018-01-25 MED ORDER — HYDROCODONE-ACETAMINOPHEN 5-325 MG PO TABS: ORAL_TABLET | ORAL | 0 refills | Status: DC

## 2018-01-25 MED ORDER — LIDOCAINE-EPINEPHRINE-TETRACAINE (LET) SOLUTION
3.0000 mL | Freq: Once | NASAL | Status: AC
  Administered 2018-01-25: 3 mL via TOPICAL
  Filled 2018-01-25: qty 3

## 2018-01-25 NOTE — ED Triage Notes (Signed)
Swelling, redness and pain to outer lt ear  X 2 days

## 2018-01-25 NOTE — ED Provider Notes (Signed)
[facility] EMERGENCY DEPARTMENT Provider Note   CSN: [id number] Arrival date & time: 01/25/18  1415     History   Chief Complaint Chief Complaint  Patient presents with  . Abscess    HPI Jerry Shepherd is a 52 y.o. male.  HPI  Jerry Shepherd is a 52 y.o. male who presents to the Emergency Department complaining of pain, redness, and swelling above his left external ear.  Symptoms have been present and worsening for 2 days.  He reports a previous boil to this area that spontaneously resolved but now has returned.  He describes a throbbing pain to his entire left ear.  He denies drainage, fever, chills, decreased hearing, dizziness or neck pain.  No history of MRSA   Past Medical History:  Diagnosis Date  . Chronic midline low back pain 10/04/2016  . Decreased libido 10/04/2016  . Depression   . Depression, major, single episode, moderate (HCC) 10/04/2016  . 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 Polyneuropathy associated with underlying disease (HCC) 10/04/2016  . Smoker 10/04/2016    Patient Active Problem List   Diagnosis Date Noted  . 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 Substance induced mood disorder (HCC) 01/30/2017  . Major depressive disorder, recurrent severe without psychotic features (HCC) 01/30/2017  . HLD (hyperlipidemia) 01/24/2017  . Right shoulder pain 01/24/2017  . PAD (peripheral artery disease) (HCC) 12/27/2016  . Welcome to Medicare preventive visit 10/24/2016  . Polyneuropathy associated with underlying disease (HCC) 10/04/2016  . Nicotine dependence 10/04/2016  . 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 Decreased libido 10/04/2016  . Chronic midline low back pain 10/04/2016    Past Surgical History:  Procedure Laterality Date  . HERNIA REPAIR        Home Medications    Prior to Admission medications   Medication Sig Start Date End Date Taking? Authorizing Provider   clindamycin (CLEOCIN) 150 MG capsule Take 2 po QID 11/27/17   Devoria AlbeKnapp, Iva, MD  gabapentin (NEURONTIN) 400 MG capsule Take 3 capsules (1,200 mg total) by mouth at bedtime. 02/03/17   Money, Gerlene Burdockravis B, FNP  hydrOXYzine (ATARAX/VISTARIL) 25 MG tablet Take 1 tablet (25 mg total) by mouth 3 (three) times daily as needed for anxiety. 02/03/17   Money, Gerlene Burdockravis B, FNP  metFORMIN (GLUCOPHAGE) 1000 MG tablet Take 1 tablet (1,000 mg total) by mouth 2 (two) times daily with a meal. For Diabetes 11/27/17   Devoria AlbeKnapp, Iva, MD  mirtazapine (REMERON) 15 MG tablet Take 1 tablet (15 mg total) by mouth at bedtime. For mood control 02/03/17   Money, Feliz Beamravis B, FNP  rosuvastatin (CRESTOR) 10 MG tablet Take 1 tablet (10 mg total) by mouth daily. 11/22/16   Helane RimaWallace, Erica, DO  rosuvastatin (CRESTOR) 10 MG tablet Take 1 tablet (10 mg total) by mouth daily at 6 PM. For high cholesterol 02/03/17   Money, Gerlene Burdockravis B, FNP  traMADol (ULTRAM) 50 MG tablet Take 2 tablets (100 mg total) by mouth every 6 (six) hours as needed. 11/27/17   Devoria AlbeKnapp, Iva, MD  traZODone (DESYREL) 50 MG tablet Take 1 tablet (50 mg total) by mouth at bedtime as needed for sleep. 02/03/17   Money, Gerlene Burdockravis B, FNP    Family History Family History  Problem Relation Age of Onset  . Diabetes Brother   . Cancer Brother   . Heart disease Father     Social History Social History   Tobacco Use  . Smoking  status: Current Every Day Smoker    Packs/day: 1.00    Years: 20.00    Pack years: 20.00    Types: Cigarettes  . Smokeless tobacco: Never Used  Substance Use Topics  . Alcohol use: No  . Drug use: No     Allergies   Patient has no known allergies.   Review of Systems Review of Systems  Constitutional: Negative for chills and fever.  HENT: Positive for ear pain. Negative for ear discharge, facial swelling and hearing loss.   Respiratory: Negative for cough and shortness of breath.   Cardiovascular: Negative for chest pain.  Gastrointestinal: Negative for  nausea and vomiting.  Musculoskeletal: Negative for arthralgias and joint swelling.  Skin: Positive for color change.       Redness, and swelling left ear  Neurological: Negative for dizziness and headaches.  Hematological: Negative for adenopathy.  All other systems reviewed and are negative.    Physical Exam Updated Vital Signs BP 108/76 (BP Location: Right Arm)   Pulse 95   Temp 98 F (36.7 C) (Oral)   Resp 20   Ht 5\' 11"  (1.803 m)   Wt 86.2 kg   SpO2 100%   BMI 26.50 kg/m   Physical Exam  Constitutional: He appears well-developed. No distress.  HENT:  Right Ear: Tympanic membrane, external ear and ear canal normal.  Left Ear: Tympanic membrane and ear canal normal. There is swelling and tenderness. No mastoid tenderness. Tympanic membrane is not erythematous and not bulging.  Ears:  Mouth/Throat: Oropharynx is clear and moist and mucous membranes are normal.  2 cm focal area of fluctuance, edema, and erythema at the cleft of the superior helix of the left external ear.  No drainage.  Neck: Normal range of motion. Neck supple.  Cardiovascular: Normal rate and regular rhythm.  Pulmonary/Chest: Effort normal and breath sounds normal. No respiratory distress.  Musculoskeletal: Normal range of motion.  Neurological: He is alert. No sensory deficit.  Skin: Skin is warm.  Nursing note and vitals reviewed.    ED Treatments / Results  Labs (all labs ordered are listed, but only abnormal results are displayed) Labs Reviewed - No data to display  EKG None  Radiology No results found.  Procedures Procedures (including critical care time)  INCISION AND DRAINAGE Performed by: Lynden Carrithers Consent: Verbal consent obtained. Risks and benefits: risks, benefits and alternatives were discussed Type: abscess  Body area: Left external ear  Anesthesia: local infiltration  Incision was made with a #11 scalpel.  Local anesthetic: LET & lidocaine 2% w/o  epinephrine  Anesthetic total: 3 ml , 2 ml  Complexity: complex Blunt dissection to break up loculations  Drainage: purulent  Drainage amount: small  Packing material: none  Patient tolerance: Patient tolerated the procedure well with no immediate complications.   Medications Ordered in ED Medications  oxyCODONE-acetaminophen (PERCOCET/ROXICET) 5-325 MG per tablet 1 tablet (has no administration in time range)  sulfamethoxazole-trimethoprim (BACTRIM DS,SEPTRA DS) 800-160 MG per tablet 1 tablet (has no administration in time range)  lidocaine-EPINEPHrine-tetracaine (LET) solution (3 mLs Topical Given 01/25/18 1442)  lidocaine (XYLOCAINE) 2 % injection 5 mL (5 mLs Intradermal Given by Other 01/25/18 1549)     Initial Impression / Assessment and Plan / ED Course  I have reviewed the triage vital signs and the nursing notes.  Pertinent labs & imaging results that were available during my care of the patient were reviewed by me and considered in my medical decision making (see chart for  details).     Patient with abscess adjacent to the external ear.  No surrounding erythema.  No mastoid tenderness. He agrees to treatment plan with antibiotics, warm compresses, and return precautions discussed.  Final Clinical Impressions(s) / ED Diagnoses   Final diagnoses:  Abscess of external ear, left    ED Discharge Orders    None       Pauline Aus, PA-C 01/26/18 1827    Mancel Bale, MD 01/27/18 1106

## 2018-01-25 NOTE — Discharge Instructions (Addendum)
Keep the area clean with mild soap and water.  Keep it bandaged for a few days.  Take the antibiotic as directed.  Apply warm water soaks 2-3 times a day until the area heals.  Follow-up with your primary doctor or return here for any worsening symptoms.

## 2018-06-22 DIAGNOSIS — Z720 Tobacco use: Secondary | ICD-10-CM | POA: Diagnosis not present

## 2018-06-22 DIAGNOSIS — J209 Acute bronchitis, unspecified: Secondary | ICD-10-CM | POA: Diagnosis not present

## 2018-10-26 ENCOUNTER — Telehealth: Payer: Self-pay | Admitting: General Practice

## 2018-10-26 NOTE — Telephone Encounter (Signed)
Dr. Wallace, please advise.  

## 2018-10-26 NOTE — Telephone Encounter (Signed)
Pt came in office wanting to schedule with Dr. Earlene Plater. I saw 2 previous no shows and she has been removed from his PCP. I did not see a note to not schedule him. Would pt need a new patient appt again? Pt stated he is diabetic and having a lot of problems wit his feet and legs. Please advise.

## 2018-10-27 NOTE — Telephone Encounter (Signed)
Please check drug database to see if there are concerns. Okay to re-establish since it has been two years IF he is able to commit to q 3 month visits and medication compliance. Okay to offer another physician in the office as well.

## 2018-10-31 NOTE — Telephone Encounter (Signed)
Please advise, see Dr. Philis Pique note. Routing to clinical team due to needing to check the drug database and needing to commit to q 3 month visits and medication compliance.

## 2018-10-31 NOTE — Telephone Encounter (Signed)
   PMP checked ok to make app just make sure he is aware that he will need follow up every 3 months. Can you call patient to make app?

## 2018-10-31 NOTE — Telephone Encounter (Signed)
Pt called to follow up on request to have Dr. Earlene Plater as his PCP. Would like callback. Please advise. [phone]

## 2018-11-05 NOTE — Progress Notes (Signed)
Patient not seen in 2 years. He stated that he has foot pain and the only thing that helps is Hydrocodone. When patient informed that I will not prescribe narcotics but would be happy to see him for DM, he declined. Will withdraw my name as his PCP.

## 2018-11-06 ENCOUNTER — Other Ambulatory Visit: Payer: Self-pay

## 2018-11-06 ENCOUNTER — Encounter: Payer: Self-pay | Admitting: Family Medicine

## 2018-11-06 ENCOUNTER — Ambulatory Visit: Payer: Medicare Other | Admitting: Family Medicine

## 2018-11-06 VITALS — Ht 71.0 in | Wt 185.0 lb

## 2018-11-06 DIAGNOSIS — G63 Polyneuropathy in diseases classified elsewhere: Secondary | ICD-10-CM

## 2018-11-06 DIAGNOSIS — F1510 Other stimulant abuse, uncomplicated: Secondary | ICD-10-CM

## 2019-01-21 DIAGNOSIS — Z79899 Other long term (current) drug therapy: Secondary | ICD-10-CM | POA: Diagnosis not present

## 2019-01-21 DIAGNOSIS — Z0000 Encounter for general adult medical examination without abnormal findings: Secondary | ICD-10-CM | POA: Diagnosis not present

## 2019-01-31 ENCOUNTER — Emergency Department (HOSPITAL_COMMUNITY)
Admission: EM | Admit: 2019-01-31 | Discharge: 2019-01-31 | Discharge status: Against Medical Advice | Payer: Medicare Other

## 2019-01-31 DIAGNOSIS — L0390 Cellulitis, unspecified: Secondary | ICD-10-CM | POA: Diagnosis not present

## 2019-09-09 ENCOUNTER — Other Ambulatory Visit: Payer: Self-pay

## 2019-09-09 ENCOUNTER — Encounter (HOSPITAL_COMMUNITY): Payer: Self-pay

## 2019-09-09 ENCOUNTER — Emergency Department (HOSPITAL_COMMUNITY)
Admission: EM | Admit: 2019-09-09 | Discharge: 2019-09-09 | Disposition: A | Discharge status: Home / Self Care | Payer: Medicare Other | Attending: Emergency Medicine | Admitting: Emergency Medicine

## 2019-09-09 DIAGNOSIS — F17210 Nicotine dependence, cigarettes, uncomplicated: Secondary | ICD-10-CM | POA: Insufficient documentation

## 2019-09-09 DIAGNOSIS — M549 Dorsalgia, unspecified: Secondary | ICD-10-CM | POA: Insufficient documentation

## 2019-09-09 DIAGNOSIS — F1410 Cocaine abuse, uncomplicated: Secondary | ICD-10-CM | POA: Insufficient documentation

## 2019-09-09 DIAGNOSIS — E119 Type 2 diabetes mellitus without complications: Secondary | ICD-10-CM | POA: Insufficient documentation

## 2019-09-09 NOTE — ED Provider Notes (Signed)
[facility]-EMERGENCY DEPT Provider Note   CSN: [id number] Arrival date & time: 09/09/19  1153     History Chief Complaint  Patient presents with  . Addiction Problem    Jerry Shepherd is a 54 y.o. male.  Jerry Shepherd is a 54 y.o. male with a history of depression, substance abuse, chronic back pain, who presents to the emergency department for help with drug addiction.  Patient states he came here because he wants help with his crack cocaine addiction.  He states he was previously in rehab but relapsed and has been using cocaine for the last 4 days.  He reports he last used about 6 hours ago, he states this is an unknown amount, states it was "a lot".  He denies any pain or injury at this time.  No chest pain or shortness of breath, no abdominal pain or headache.  Patient denies any thoughts of hurting himself just states that he is at his wits end, states he has no plan to hurt himself.  He just wants some help with his substance abuse.  Denies any thoughts of hurting others or hallucinations.  States he was previously in a rehab facility [facility].        Past Medical History:  Diagnosis Date  . Chronic midline low back pain 10/04/2016  . Decreased libido 10/04/2016  . Depression   . Depression, major, single episode, moderate (HCC) 10/04/2016  . 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 Polyneuropathy associated with underlying disease (HCC) 10/04/2016  . Smoker 10/04/2016    Patient Active Problem List   Diagnosis Date Noted  . 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 Substance induced mood disorder (HCC) 01/30/2017  . Major depressive disorder, recurrent severe without psychotic features (HCC) 01/30/2017  . HLD (hyperlipidemia) 01/24/2017  . Right shoulder pain 01/24/2017  . PAD (peripheral artery disease) (HCC) 12/27/2016  . Welcome to Medicare preventive visit 10/24/2016  . Polyneuropathy associated with underlying  disease (HCC) 10/04/2016  . Nicotine dependence 10/04/2016  . 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 Decreased libido 10/04/2016  . Chronic midline low back pain 10/04/2016    Past Surgical History:  Procedure Laterality Date  . HERNIA REPAIR         Family History  Problem Relation Age of Onset  . Diabetes Brother   . Cancer Brother   . Heart disease Father     Social History   Tobacco Use  . Smoking status: Current Every Day Smoker    Packs/day: 1.00    Years: 20.00    Pack years: 20.00    Types: Cigarettes  . Smokeless tobacco: Never Used  Substance Use Topics  . Alcohol use: No  . Drug use: Yes    Types: Cocaine    Home Medications Prior to Admission medications   Medication Sig Start Date End Date Taking? Authorizing Provider  mirtazapine (REMERON) 15 MG tablet Take 1 tablet (15 mg total) by mouth at bedtime. For mood control 02/03/17   Money, Gerlene Burdock, FNP    Allergies    Patient has no known allergies.  Review of Systems   Review of Systems  Constitutional: Negative for chills and fever.  HENT: Negative.   Eyes: Negative for visual disturbance.  Respiratory: Negative for cough and shortness of breath.   Cardiovascular: Negative for chest pain.  Gastrointestinal: Negative for abdominal pain.  Musculoskeletal: Negative for myalgias.  Neurological: Negative  for headaches.  All other systems reviewed and are negative.   Physical Exam Updated Vital Signs BP 119/87   Pulse (!) 116   Temp 98.6 F (37 C)   Resp 18   Wt 88.5 kg   SpO2 100%   BMI 27.20 kg/m   Physical Exam Vitals and nursing note reviewed.  Constitutional:      General: He is not in acute distress.    Appearance: Normal appearance. He is well-developed and normal weight. He is not ill-appearing or diaphoretic.  HENT:     Head: Normocephalic and atraumatic.  Eyes:     General:        Right eye: No discharge.        Left eye: No  discharge.  Cardiovascular:     Rate and Rhythm: Normal rate and regular rhythm.     Heart sounds: Normal heart sounds. No murmur. No friction rub. No gallop.   Pulmonary:     Effort: Pulmonary effort is normal. No respiratory distress.     Breath sounds: Normal breath sounds. No wheezing or rales.     Comments: Respirations equal and unlabored, patient able to speak in full sentences, lungs clear to auscultation bilaterally Abdominal:     General: Bowel sounds are normal. There is no distension.     Palpations: Abdomen is soft. There is no mass.     Tenderness: There is no abdominal tenderness. There is no guarding.     Comments: Abdomen soft, nondistended, nontender to palpation in all quadrants without guarding or peritoneal signs  Musculoskeletal:        General: No deformity.     Cervical back: Neck supple.  Skin:    General: Skin is warm and dry.     Capillary Refill: Capillary refill takes less than 2 seconds.  Neurological:     Mental Status: He is alert.     Coordination: Coordination normal.     Comments: Speech is clear, able to follow commands Moves extremities without ataxia, coordination intact  Psychiatric:        Attention and Perception: He does not perceive auditory or visual hallucinations.        Mood and Affect: Mood normal.        Behavior: Behavior normal.        Thought Content: Thought content does not include homicidal or suicidal ideation. Thought content does not include homicidal or suicidal plan.     ED Results / Procedures / Treatments   Labs (all labs ordered are listed, but only abnormal results are displayed) Labs Reviewed  RAPID URINE DRUG SCREEN, HOSP PERFORMED    EKG None  Radiology No results found.  Procedures Procedures (including critical care time)  Medications Ordered in ED Medications - No data to display  ED Course  I have reviewed the triage vital signs and the nursing notes.  Pertinent labs & imaging results that  were available during my care of the patient were reviewed by me and considered in my medical decision making (see chart for details).    MDM Rules/Calculators/A&P                      54 year old male presents requesting help with crack cocaine addiction.  States he was previously in rehab, but relapsed and has been using crack cocaine for the past 4 days.  Last use 6 hours ago, on arrival patient is mildly tachycardic, but he is well-appearing, denies chest pain or shortness  of breath, or any other focal symptoms, no other medical complaints.  Denies SI or HI, just states that he wants help with his drug problem and that would make him feel better.  Discussed with patient that we do not admit for drug detox here, will provide patient with resources for inpatient and outpatient substance abuse treatment.  At this time feel he is safe for discharge home.  He expresses understanding and agreement with this plan.  Will plan to use resources to go and get help today.  Final Clinical Impression(s) / ED Diagnoses Final diagnoses:  Cocaine abuse [facility])    Rx / DC Orders ED Discharge Orders    None       Legrand Rams 09/09/19 1232    Pricilla Loveless, MD 09/10/19 [phone]

## 2019-09-09 NOTE — ED Triage Notes (Signed)
Pt arrived POV ambulatory into ED CC Drug addiction. Pt states " I came here bc I want help with my crack cocaine addiction I have went to a treatment center and now I have relapsed". Pt reports last using crack cocaine 6 hours ago, unknown amount "a lot" . Pt denies injury or pain at this time. Pt states " I just want some food and a place to sleep I need help is all"   VSS HR 119 Afebrile

## 2019-09-09 NOTE — Discharge Instructions (Addendum)
Please use resources provided for help with substance abuse treatment.  Return to the ED if you develop chest pain, or any other new or concerning symptoms, or if you have thoughts of hurting yourself.

## 2019-09-09 NOTE — ED Notes (Addendum)
Pt verbalizes understanding of DC instructions. Pt belongings returned and is ambulatory out of ED.    Pt had cell phone to call resources. Pt left before signing signature pad

## 2019-11-05 DEATH — deceased
